# Patient Record
Sex: Female | Born: 1975 | Race: White | Hispanic: No | Smoking: Former smoker
Health system: Southern US, Community
[De-identification: ages and names within clinical notes are randomized; demographics above are authoritative.]

## PROBLEM LIST (undated history)

## (undated) DIAGNOSIS — F32A Depression, unspecified: Secondary | ICD-10-CM

## (undated) DIAGNOSIS — R9431 Abnormal electrocardiogram [ECG] [EKG]: Secondary | ICD-10-CM

## (undated) DIAGNOSIS — E559 Vitamin D deficiency, unspecified: Secondary | ICD-10-CM

## (undated) DIAGNOSIS — M51369 Other intervertebral disc degeneration, lumbar region without mention of lumbar back pain or lower extremity pain: Secondary | ICD-10-CM

## (undated) DIAGNOSIS — F419 Anxiety disorder, unspecified: Secondary | ICD-10-CM

## (undated) DIAGNOSIS — R202 Paresthesia of skin: Secondary | ICD-10-CM

## (undated) DIAGNOSIS — R011 Cardiac murmur, unspecified: Secondary | ICD-10-CM

## (undated) DIAGNOSIS — I447 Left bundle-branch block, unspecified: Secondary | ICD-10-CM

## (undated) DIAGNOSIS — G4730 Sleep apnea, unspecified: Secondary | ICD-10-CM

## (undated) DIAGNOSIS — H15009 Unspecified scleritis, unspecified eye: Secondary | ICD-10-CM

## (undated) DIAGNOSIS — M069 Rheumatoid arthritis, unspecified: Secondary | ICD-10-CM

## (undated) DIAGNOSIS — T8859XA Other complications of anesthesia, initial encounter: Secondary | ICD-10-CM

## (undated) DIAGNOSIS — M5136 Other intervertebral disc degeneration, lumbar region: Secondary | ICD-10-CM

## (undated) DIAGNOSIS — M5440 Lumbago with sciatica, unspecified side: Secondary | ICD-10-CM

## (undated) HISTORY — DX: Other intervertebral disc degeneration, lumbar region: M51.36

## (undated) HISTORY — DX: Rheumatoid arthritis, unspecified: M06.9

## (undated) HISTORY — DX: Paresthesia of skin: R20.2

## (undated) HISTORY — PX: LAPAROSCOPIC HYSTERECTOMY: SHX1926

## (undated) HISTORY — DX: Lumbago with sciatica, unspecified side: M54.40

## (undated) HISTORY — PX: LAMINECTOMY: SHX219

## (undated) HISTORY — DX: Left bundle-branch block, unspecified: I44.7

## (undated) HISTORY — DX: Unspecified scleritis, unspecified eye: H15.009

## (undated) HISTORY — DX: Vitamin D deficiency, unspecified: E55.9

## (undated) HISTORY — DX: Other intervertebral disc degeneration, lumbar region without mention of lumbar back pain or lower extremity pain: M51.369

## (undated) HISTORY — DX: Abnormal electrocardiogram (ECG) (EKG): R94.31

## (undated) HISTORY — PX: SPINAL CORD STIMULATOR INSERTION: SHX5378

---
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
## 2002-07-16 DIAGNOSIS — K649 Unspecified hemorrhoids: Secondary | ICD-10-CM | POA: Insufficient documentation

## 2003-11-08 HISTORY — PX: NISSEN FUNDOPLICATION: SHX2091

## 2017-05-18 DIAGNOSIS — M4310 Spondylolisthesis, site unspecified: Secondary | ICD-10-CM | POA: Insufficient documentation

## 2020-05-18 DIAGNOSIS — S0300XA Dislocation of jaw, unspecified side, initial encounter: Secondary | ICD-10-CM | POA: Insufficient documentation

## 2021-10-20 ENCOUNTER — Other Ambulatory Visit: Payer: Self-pay | Admitting: Student in an Organized Health Care Education/Training Program

## 2021-10-20 DIAGNOSIS — M5412 Radiculopathy, cervical region: Secondary | ICD-10-CM

## 2021-10-26 ENCOUNTER — Other Ambulatory Visit: Payer: Self-pay | Admitting: Internal Medicine

## 2021-10-26 DIAGNOSIS — R200 Anesthesia of skin: Secondary | ICD-10-CM

## 2021-10-26 DIAGNOSIS — R202 Paresthesia of skin: Secondary | ICD-10-CM

## 2021-11-10 ENCOUNTER — Ambulatory Visit
Admission: RE | Admit: 2021-11-10 | Discharge: 2021-11-10 | Disposition: A | Discharge status: Home / Self Care | Payer: BC Managed Care – PPO | Source: Ambulatory Visit | Attending: Student in an Organized Health Care Education/Training Program | Admitting: Student in an Organized Health Care Education/Training Program

## 2021-11-10 DIAGNOSIS — M5412 Radiculopathy, cervical region: Secondary | ICD-10-CM

## 2021-11-18 ENCOUNTER — Ambulatory Visit
Admission: RE | Admit: 2021-11-18 | Discharge: 2021-11-18 | Disposition: A | Discharge status: Home / Self Care | Payer: BC Managed Care – PPO | Source: Ambulatory Visit | Attending: Internal Medicine | Admitting: Internal Medicine

## 2021-11-18 DIAGNOSIS — R200 Anesthesia of skin: Secondary | ICD-10-CM

## 2021-11-18 DIAGNOSIS — R202 Paresthesia of skin: Secondary | ICD-10-CM

## 2021-11-18 MED ORDER — GADOBENATE DIMEGLUMINE 529 MG/ML IV SOLN
12.0000 mL | Freq: Once | INTRAVENOUS | Status: AC | PRN
  Administered 2021-11-18: 12 mL via INTRAVENOUS

## 2022-03-07 ENCOUNTER — Other Ambulatory Visit: Payer: Self-pay | Admitting: Orthopedic Surgery

## 2022-03-07 DIAGNOSIS — M961 Postlaminectomy syndrome, not elsewhere classified: Secondary | ICD-10-CM

## 2022-03-07 DIAGNOSIS — Q762 Congenital spondylolisthesis: Secondary | ICD-10-CM

## 2022-03-12 ENCOUNTER — Ambulatory Visit
Admission: RE | Admit: 2022-03-12 | Discharge: 2022-03-12 | Disposition: A | Discharge status: Home / Self Care | Payer: BC Managed Care – PPO | Source: Ambulatory Visit | Attending: Orthopedic Surgery | Admitting: Orthopedic Surgery

## 2022-03-12 DIAGNOSIS — M961 Postlaminectomy syndrome, not elsewhere classified: Secondary | ICD-10-CM

## 2022-03-12 DIAGNOSIS — Q762 Congenital spondylolisthesis: Secondary | ICD-10-CM

## 2022-04-18 ENCOUNTER — Other Ambulatory Visit: Payer: Self-pay | Admitting: Orthopedic Surgery

## 2022-04-18 DIAGNOSIS — M961 Postlaminectomy syndrome, not elsewhere classified: Secondary | ICD-10-CM

## 2022-04-18 DIAGNOSIS — M5432 Sciatica, left side: Secondary | ICD-10-CM

## 2022-04-18 DIAGNOSIS — M5431 Sciatica, right side: Secondary | ICD-10-CM

## 2022-04-27 ENCOUNTER — Ambulatory Visit
Admission: RE | Admit: 2022-04-27 | Discharge: 2022-04-27 | Disposition: A | Discharge status: Home / Self Care | Payer: BC Managed Care – PPO | Source: Ambulatory Visit | Attending: Orthopedic Surgery | Admitting: Orthopedic Surgery

## 2022-04-27 DIAGNOSIS — M961 Postlaminectomy syndrome, not elsewhere classified: Secondary | ICD-10-CM

## 2022-04-27 DIAGNOSIS — M5432 Sciatica, left side: Secondary | ICD-10-CM

## 2022-04-27 DIAGNOSIS — M5431 Sciatica, right side: Secondary | ICD-10-CM

## 2022-05-18 NOTE — Progress Notes (Unsigned)
  Cardiology Office Note:    Date:  05/18/2022   ID:  Nancy [name], DOB Jul 03, 1976, MRN [id number]  PCP:  [facility]   [facility] Providers Cardiologist:  Alverda Skeans, MD Referring MD: Linus Galas, NP   Chief Complaint/Reason for Referral: Left bundle branch block and preoperative evaluation  PATIENT DID NOT APPEAR FOR APPOINTMENT   ASSESSMENT:    1. Preoperative cardiovascular examination   2. Left bundle branch block     PLAN:    In order of problems listed above: 1.  Preoperative cardiovascular examination:  2.  Left bundle branch block:        History of Present Illness:    FOCUSED PROBLEM LIST:   1.  Chronic back pain status post L4-S1 repair 2.  Left bundle branch block  The patient is a 46 y.o. female with the indicated medical history here for recommendations regarding an abnormal EKG and preoperative evaluation.  The patient was seen by her primary care provider recently.  She is to have a revision of a previous back repair.  EKG was done which is not available for review but per report demonstrated sinus rhythm with a left bundle branch block and PVCs.      EKG:    Prior CV studies: None available  Other studies Reviewed: Review of the additional studies/records demonstrates: CT lumbar spine with L4-L5 disc bulging; no aortic atherosclerosis seen Signed, Orbie Pyo, MD  05/18/2022 9:35 AM    [facility] [address] [place], [place], [place]  [address] Phone: [phone]; Fax: [phone]   Note:  This document was prepared using Dragon voice recognition software and may include unintentional dictation errors.

## 2022-05-19 ENCOUNTER — Ambulatory Visit (INDEPENDENT_AMBULATORY_CARE_PROVIDER_SITE_OTHER): Payer: BC Managed Care – PPO | Admitting: Internal Medicine

## 2022-05-19 DIAGNOSIS — Z01810 Encounter for preprocedural cardiovascular examination: Secondary | ICD-10-CM

## 2022-05-19 DIAGNOSIS — I447 Left bundle-branch block, unspecified: Secondary | ICD-10-CM

## 2022-05-23 ENCOUNTER — Ambulatory Visit (INDEPENDENT_AMBULATORY_CARE_PROVIDER_SITE_OTHER): Payer: BC Managed Care – PPO | Admitting: Cardiology

## 2022-05-23 ENCOUNTER — Encounter: Payer: Self-pay | Admitting: Cardiology

## 2022-05-23 VITALS — BP 122/84 | HR 74 | Ht 59.5 in | Wt 146.0 lb

## 2022-05-23 DIAGNOSIS — I447 Left bundle-branch block, unspecified: Secondary | ICD-10-CM

## 2022-05-23 DIAGNOSIS — Z01810 Encounter for preprocedural cardiovascular examination: Secondary | ICD-10-CM

## 2022-05-23 MED ORDER — METOPROLOL TARTRATE 100 MG PO TABS: 100.0000 mg | ORAL_TABLET | Freq: Once | ORAL | 0 refills | Status: DC

## 2022-05-23 NOTE — Addendum Note (Signed)
Addended by: Theresia Majors on: 05/23/2022 10:37 AM   Modules accepted: Orders

## 2022-05-23 NOTE — Patient Instructions (Addendum)
Medication Instructions:  Your physician recommends that you continue on your current medications as directed. Please refer to the Current Medication list given to you today.  *If you need a refill on your cardiac medications before your next appointment, please call your pharmacy*  Testing/Procedures: Your physician has requested that you have an echocardiogram. Echocardiography is a painless test that uses sound waves to create images of your heart. It provides your doctor with information about the size and shape of your heart and how well your heart's chambers and valves are working. This procedure takes approximately one hour. There are no restrictions for this procedure.  Your physician has requested that you have cardiac CT. Cardiac computed tomography (CT) is a painless test that uses an x-ray machine to take clear, detailed pictures of your heart. Please follow instruction sheet as given.  Follow-Up: At [facility], you and your health needs are our priority.  As part of our continuing mission to provide you with exceptional heart care, we have created designated Provider Care Teams.  These Care Teams include your primary Cardiologist (physician) and Advanced Practice Providers (APPs -  Physician Assistants and Nurse Practitioners) who all work together to provide you with the care you need, when you need it.  Your next appointment:   Follow up with Dr. Mayford Knife as needed based on results of testing.   Other Instructions   Your cardiac CT will be scheduled at:   [facility] [address] [place], [place] [address] [phone]  Please arrive at the [facility] and Children's Entrance (Entrance C2) of [facility] 30 minutes prior to test start time. You can use the FREE valet parking offered at entrance C (encouraged to control the heart rate for the test)  Proceed to the [facility] Radiology Department (first floor) to check-in and test prep.  All radiology  patients and guests should use entrance C2 at [facility], accessed from [facility], even though the hospital's physical address listed is [address]     Please follow these instructions carefully (unless otherwise directed):  On the Night Before the Test: Be sure to Drink plenty of water. Do not consume any caffeinated/decaffeinated beverages or chocolate 12 hours prior to your test. Do not take any antihistamines 12 hours prior to your test.  On the Day of the Test: Drink plenty of water until 1 hour prior to the test. Do not eat any food 4 hours prior to the test. You may take your regular medications prior to the test.  Take metoprolol (Lopressor) two hours prior to test. FEMALES- please wear underwire-free bra if available, avoid dresses & tight clothing  After the Test: Drink plenty of water. After receiving IV contrast, you may experience a mild flushed feeling. This is normal. On occasion, you may experience a mild rash up to 24 hours after the test. This is not dangerous. If this occurs, you can take Benadryl 25 mg and increase your fluid intake. If you experience trouble breathing, this can be serious. If it is severe call 911 IMMEDIATELY. If it is mild, please call our office. If you take any of these medications: Glipizide/Metformin, Avandament, Glucavance, please do not take 48 hours after completing test unless otherwise instructed.  We will call to schedule your test 2-4 weeks out understanding that some insurance companies will need an authorization prior to the service being performed.   For non-scheduling related questions, please contact the cardiac imaging nurse navigator  should you have any questions/concerns: Rockwell Alexandria, Cardiac Imaging Nurse Navigator Larey Brick, Cardiac Imaging Nurse Navigator [facility] Direct Office Dial: [phone]   For scheduling needs, including cancellations and  rescheduling, please call [place], [phone].   Important Information About Sugar

## 2022-05-23 NOTE — Progress Notes (Signed)
Cardiology CONSULT Note    Date:  05/23/2022   ID:  Nancy [name], DOB 04-07-1976, MRN [id number]  PCP:  [facility]  Cardiologist:  Armanda Magic, MD   Chief Complaint  Patient presents with   New Patient (Initial Visit)    Preoperative cardiovascular clearance and left bundle branch block    History of Present Illness:  Nancy [name] is a 46 y.o. female who is being seen today for the evaluation of preoperative cardiac clearance for back surgery and left bundle branch block at the request of Linus Galas, NP.  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  She has had a prior laminectomy in the past and is now referred for preoperative clearance for further lumbar back surgery.  She denies any chest pain or chest pressure but occasionally has problems with GERD (had Nissen in the past).  At times she will have some mild DOE when getting out of bed.  She says that about 4-5 months ago she was swimming and noticed her heart racing and skipped a beat but has not had any further episodes.  She denies any  PND, orthopnea or syncope.  She has had some LE edema in the past. She has a hx of tobacco use in the past and quit 5 years ago but started when she was a teenager.   Her brother died of suicide and was found to have CAD at autopsy.  She does not keep in touch with her other biological family.   Past Medical History:  Diagnosis Date   Abnormal EKG    DDD (degenerative disc disease), lumbar    LBBB (left bundle branch block)    Lumbago with sciatica    Paresthesia of lower lip    Rheumatoid arthritis (HCC)    Scleritis    Vitamin D deficiency     Past Surgical History:  Procedure Laterality Date   CESAREAN SECTION  1995   LAMINECTOMY     2019   LAPAROSCOPIC HYSTERECTOMY     2010   NISSEN FUNDOPLICATION  2005   SPINAL CORD STIMULATOR INSERTION     2021    Current Medications: Current Meds   Medication Sig   cycloSPORINE (RESTASIS) 0.05 % ophthalmic emulsion Place 1 drop into both eyes 2 (two) times daily.   cycloSPORINE (RESTASIS) 0.05 % ophthalmic emulsion Place 1 drop into both eyes daily at 12 noon.   fluticasone (FLONASE) 50 MCG/ACT nasal spray Place into both nostrils daily.   inFLIXimab-axxq (AVSOLA) 100 MG injection Inject 100 mg into the vein every 6 (six) weeks.   leflunomide (ARAVA) 20 MG tablet Take 20 mg by mouth daily.   Multiple Vitamin (MULTIVITAMIN ADULT PO) Take by mouth.   Probiotic Product (PROBIOTIC PO) Take by mouth.   Vitamin D, Ergocalciferol, (DRISDOL) 1.25 MG (50000 UNIT) CAPS capsule Take 50,000 Units by mouth every 7 (seven) days.    Allergies:   Erythromycin, Nabumetone, and Penicillins   Social History   Socioeconomic History   Marital status: Divorced    Spouse name: Not on file   Number of children: Not on file   Years of education: Not on file   Highest education level: Not on file  Occupational History   Not on file  Tobacco Use   Smoking status: Never   Smokeless tobacco: Never  Substance and Sexual Activity   Alcohol use: Never   Drug use: Never   Sexual activity: Not  on file  Other Topics Concern   Not on file  Social History Narrative   Not on file   Social Determinants of Health   Financial Resource Strain: Not on file  Food Insecurity: Not on file  Transportation Needs: Not on file  Physical Activity: Not on file  Stress: Not on file  Social Connections: Not on file     Family History:  The patient's family history includes Coronary artery disease in her brother; Rheum arthritis in her maternal grandmother and mother; Suicidality (age of onset: 72) in her brother.   ROS:   Please see the history of present illness.    ROS All other systems reviewed and are negative.      No data to display             PHYSICAL EXAM:   VS:  BP 122/84   Pulse 74   Ht 4' 11.5" (1.511 m)   Wt 146 lb (66.2 kg)   SpO2  97%   BMI 28.99 kg/m    GEN: Well nourished, well developed, in no acute distress  HEENT: normal  Neck: no JVD, carotid bruits, or masses Cardiac: RRR; no murmurs, rubs, or gallops,no edema.  Intact distal pulses bilaterally.  Respiratory:  clear to auscultation bilaterally, normal work of breathing GI: soft, nontender, nondistended, + BS MS: no deformity or atrophy  Skin: warm and dry, no rash Neuro:  Alert and Oriented x 3, Strength and sensation are intact Psych: euthymic mood, full affect  Wt Readings from Last 3 Encounters:  05/23/22 146 lb (66.2 kg)      Studies/Labs Reviewed:   EKG:  EKG is not ordered today.  The ekg performed by PCP 05/16/2022 shows normal sinus rhythm with left bundle branch block.  I do not have any old EKG to compare  to  Recent Labs: No results found for requested labs within last 365 days.   Lipid Panel No results found for: "CHOL", "TRIG", "HDL", "CHOLHDL", "VLDL", "LDLCALC", "LDLDIRECT"   Additional studies/ records that were reviewed today include:  EKG in office visit notes from PCP    ASSESSMENT:    1. Left bundle branch block   2. Preoperative cardiovascular examination   3. LBBB (left bundle branch block)      PLAN:  In order of problems listed above:  Left bundle branch block -There is no history of left bundle branch block in the past but there is no old EKG for me to review or for her PCP to review so do not know the chronicity of this.  The patient says she is never been told she has abnormal EKG in the past -Check 2D echocardiogram to assess LV function -She has CRFs including hx of tobacco use and fm hx of CAD at an early age -check coronary CTA since she has had some exertional SOB  2.  Cardiac clearance -She is having revision of prior back surgery -she has had some mild DOE and has a hx of tobacco use and premature CAD in her brother -I think we should get a coronary CTA to define coronary anatomy due to her sx, CRFs  and abnormal EKG prior to surgery  Recommendations: According to ACC/AHA guidelines, no further cardiovascular testing needed.  The patient may proceed to surgery at acceptable risk.     Time Spent: 20 minutes total time of encounter, including 15 minutes spent in face-to-face patient care on the date of this encounter. This time includes coordination of  care and counseling regarding above mentioned problem list. Remainder of non-face-to-face time involved reviewing chart documents/testing relevant to the patient encounter and documentation in the medical record. I have independently reviewed documentation from referring provider  Medication Adjustments/Labs and Tests Ordered: Current medicines are reviewed at length with the patient today.  Concerns regarding medicines are outlined above.  Medication changes, Labs and Tests ordered today are listed in the Patient Instructions below.  There are no Patient Instructions on file for this visit.   Signed, Armanda Magic, MD  05/23/2022 10:29 AM    [facility] [address] [place], [place], [place]  [address] Phone: [phone]; Fax: [phone]

## 2022-05-24 DIAGNOSIS — M069 Rheumatoid arthritis, unspecified: Secondary | ICD-10-CM | POA: Insufficient documentation

## 2022-05-31 ENCOUNTER — Telehealth: Payer: Self-pay | Admitting: *Deleted

## 2022-05-31 NOTE — Telephone Encounter (Signed)
   Pre-operative Risk Assessment    Patient Name: Nancy [name]  DOB: 07/20/1976 MRN: [id number]      Request for Surgical Clearance    Procedure:   REVISION L4-S1 PSF  Date of Surgery:  Clearance TBD                                 Surgeon:  DR. Malachy Chamber Surgeon's Group or Practice Name:  [facility]  Phone number:  [phone] Fax number:  [phone] OR [phone]   Type of Clearance Requested:   - Medical ; NO MEDICATIONS ARE LISTED AS NEEDING TO BE HELD   Type of Anesthesia:  Not Indicated   Additional requests/questions:    Elpidio Anis   05/31/2022, 6:20 PM

## 2022-06-01 ENCOUNTER — Ambulatory Visit: Payer: BC Managed Care – PPO | Admitting: Internal Medicine

## 2022-06-01 NOTE — Telephone Encounter (Signed)
Pending echocardiogram on 8/3 and coronary CT on 8/7 before final clearance

## 2022-06-08 ENCOUNTER — Ambulatory Visit: Payer: BC Managed Care – PPO | Admitting: Cardiovascular Disease

## 2022-06-09 ENCOUNTER — Ambulatory Visit (HOSPITAL_COMMUNITY): Payer: BC Managed Care – PPO | Attending: Cardiology

## 2022-06-09 DIAGNOSIS — I447 Left bundle-branch block, unspecified: Secondary | ICD-10-CM

## 2022-06-09 DIAGNOSIS — Z01810 Encounter for preprocedural cardiovascular examination: Secondary | ICD-10-CM | POA: Diagnosis present

## 2022-06-09 LAB — ECHOCARDIOGRAM COMPLETE
AR max vel: 3.36 cm2
AV Area VTI: 3.18 cm2
AV Area mean vel: 3.22 cm2
AV Mean grad: 2.3 mmHg
AV Peak grad: 4.4 mmHg
Ao pk vel: 1.05 m/s
Area-P 1/2: 3.45 cm2
S' Lateral: 3.33 cm

## 2022-06-10 ENCOUNTER — Telehealth (HOSPITAL_COMMUNITY): Payer: Self-pay | Admitting: *Deleted

## 2022-06-10 NOTE — Telephone Encounter (Signed)
Reaching out to patient to offer assistance regarding upcoming cardiac imaging study; pt verbalizes understanding of appt date/time, parking situation and where to check in, pre-test NPO status and medications ordered, and verified current allergies; name and call back number provided for further questions should they arise  Janari Yamada RN Navigator Cardiac Imaging [facility] [phone] office [phone] cell  Patient to take 100mg metoprolol tartrate two hours prior to her cardiac CT scan.  She is aware to arrive at 2pm. 

## 2022-06-12 ENCOUNTER — Telehealth (HOSPITAL_COMMUNITY): Payer: Self-pay | Admitting: Emergency Medicine

## 2022-06-12 NOTE — Telephone Encounter (Signed)
Calling to inform patient that her CCTA appt was cancelled tomorrow due to reading software glitch. Pt verbally upset over delay in this test.  New appt for 8/17 at 1:30pm  Rockwell Alexandria RN Navigator Cardiac Imaging [facility] [phone] Office  [phone] Cell

## 2022-06-13 ENCOUNTER — Ambulatory Visit (HOSPITAL_COMMUNITY): Admission: RE | Admit: 2022-06-13 | Payer: BC Managed Care – PPO | Source: Ambulatory Visit

## 2022-06-13 ENCOUNTER — Emergency Department (HOSPITAL_COMMUNITY)
Admission: EM | Admit: 2022-06-13 | Discharge: 2022-06-13 | Disposition: A | Discharge status: Home / Self Care | Payer: BC Managed Care – PPO | Attending: Emergency Medicine | Admitting: Emergency Medicine

## 2022-06-13 ENCOUNTER — Encounter (HOSPITAL_COMMUNITY): Payer: Self-pay | Admitting: Emergency Medicine

## 2022-06-13 ENCOUNTER — Other Ambulatory Visit: Payer: Self-pay

## 2022-06-13 ENCOUNTER — Emergency Department (HOSPITAL_COMMUNITY): Payer: BC Managed Care – PPO

## 2022-06-13 DIAGNOSIS — R110 Nausea: Secondary | ICD-10-CM | POA: Diagnosis not present

## 2022-06-13 DIAGNOSIS — R0789 Other chest pain: Secondary | ICD-10-CM | POA: Insufficient documentation

## 2022-06-13 DIAGNOSIS — R079 Chest pain, unspecified: Secondary | ICD-10-CM | POA: Diagnosis present

## 2022-06-13 LAB — I-STAT BETA HCG BLOOD, ED (MC, WL, AP ONLY): I-stat hCG, quantitative: <5 m[IU]/mL (ref <5)

## 2022-06-13 LAB — BASIC METABOLIC PANEL
Anion gap: 6 (ref 5–15)
BUN: 6 mg/dL (ref 6–20)
CO2: 24 mmol/L (ref 22–32)
Calcium: 9.2 mg/dL (ref 8.9–10.3)
Chloride: 108 mmol/L (ref 98–111)
Creatinine, Ser: 0.79 mg/dL (ref 0.44–1.00)
GFR, Estimated: >60 mL/min (ref >60)
Glucose, Bld: 108 mg/dL — ABNORMAL HIGH (ref 70–99)
Potassium: 4.1 mmol/L (ref 3.5–5.1)
Sodium: 138 mmol/L (ref 135–145)

## 2022-06-13 LAB — CBC
HCT: 44.7 % (ref 36.0–46.0)
Hemoglobin: 15 g/dL (ref 12.0–15.0)
MCH: 31.6 pg (ref 26.0–34.0)
MCHC: 33.6 g/dL (ref 30.0–36.0)
MCV: 94.3 fL (ref 80.0–100.0)
Platelets: 309 10*3/uL (ref 150–400)
RBC: 4.74 MIL/uL (ref 3.87–5.11)
RDW: 12.4 % (ref 11.5–15.5)
WBC: 4.3 10*3/uL (ref 4.0–10.5)
nRBC: 0 % (ref 0.0–0.2)

## 2022-06-13 LAB — TROPONIN I (HIGH SENSITIVITY)
Troponin I (High Sensitivity): 3 ng/L (ref <18)
Troponin I (High Sensitivity): <2 ng/L (ref <18)

## 2022-06-13 NOTE — ED Provider Notes (Signed)
[facility] EMERGENCY DEPARTMENT Provider Note   CSN: [id number] Arrival date & time: 06/13/22  9983     History  Chief Complaint  Patient presents with   Chest Pain    Nancy [name] is a 46 y.o. female. With past medical history of LBBB, RA, DDD who presents to the emergency department with chest pain.  States she woke up around 7:00 this morning and had a sharp, constant, nonradiating chest pain in the left side of her chest.  States that it was a sharp and shooting.  She had associated nausea without vomiting.  Denies shortness of breath, palpitations, diaphoresis.  Denies radiation down her arm or jaw.  Denies lower extremity swelling.  States that the pain lasted until about 1245 prior to me evaluating her and she is now chest pain-free.  She has a known left bundle branch block and is currently being evaluated by cardiology as a preoperative work-up for back surgery.   Chest Pain Associated symptoms: nausea   Associated symptoms: no diaphoresis, no dizziness, no palpitations, no shortness of breath and no vomiting        Home Medications Prior to Admission medications   Medication Sig Start Date End Date Taking? Authorizing Provider  cycloSPORINE (RESTASIS) 0.05 % ophthalmic emulsion Place 1 drop into both eyes 2 (two) times daily.    [provider]  cycloSPORINE (RESTASIS) 0.05 % ophthalmic emulsion Place 1 drop into both eyes daily at 12 noon.    [provider]  fluticasone (FLONASE) 50 MCG/ACT nasal spray Place into both nostrils daily.    [provider]  inFLIXimab-axxq (AVSOLA) 100 MG injection Inject 100 mg into the vein every 6 (six) weeks.    [provider]  leflunomide (ARAVA) 20 MG tablet Take 20 mg by mouth daily.    [provider]  metoprolol tartrate (LOPRESSOR) 100 MG tablet Take 1 tablet (100 mg total) by mouth once for 1 dose. Take 1 tablet (100 mg total) two hours prior to CT scan. 05/23/22  05/23/22  Quintella Reichert, MD  Multiple Vitamin (MULTIVITAMIN ADULT PO) Take by mouth.    [provider]  Probiotic Product (PROBIOTIC PO) Take by mouth.    [provider]  Vitamin D, Ergocalciferol, (DRISDOL) 1.25 MG (50000 UNIT) CAPS capsule Take 50,000 Units by mouth every 7 (seven) days.    [provider]      Allergies    Erythromycin, Nabumetone, and Penicillins    Review of Systems   Review of Systems  Constitutional:  Negative for diaphoresis.  Respiratory:  Negative for shortness of breath.   Cardiovascular:  Positive for chest pain. Negative for palpitations and leg swelling.  Gastrointestinal:  Positive for nausea. Negative for vomiting.  Neurological:  Negative for dizziness, syncope and light-headedness.  All other systems reviewed and are negative.   Physical Exam Updated Vital Signs BP 129/80 (BP Location: Left Arm)   Pulse 60   Temp 98.1 F (36.7 C) (Oral)   Resp 17   Ht 5' (1.524 m)   Wt 66 kg   SpO2 99%   BMI 28.42 kg/m  Physical Exam Vitals and nursing note reviewed.  Constitutional:      General: She is not in acute distress.    Appearance: Normal appearance. She is well-developed and normal weight. She is not ill-appearing or diaphoretic.  HENT:     Head: Normocephalic and atraumatic.     Mouth/Throat:     Mouth: Mucous membranes  are moist.     Pharynx: Oropharynx is clear.  Eyes:     General: No scleral icterus.    Extraocular Movements: Extraocular movements intact.     Pupils: Pupils are equal, round, and reactive to light.  Neck:     Vascular: No JVD.  Cardiovascular:     Rate and Rhythm: Normal rate and regular rhythm.     Pulses: Normal pulses.          Radial pulses are 2+ on the right side and 2+ on the left side.     Heart sounds: Normal heart sounds. No murmur heard. Pulmonary:     Effort: Pulmonary effort is normal. No tachypnea or respiratory distress.     Breath sounds: Normal breath sounds.  Chest:      Chest wall: No tenderness.  Abdominal:     General: Bowel sounds are normal.     Palpations: Abdomen is soft.  Musculoskeletal:        General: Normal range of motion.     Cervical back: Normal range of motion and neck supple.     Right lower leg: No edema.     Left lower leg: No edema.  Skin:    General: Skin is warm and dry.     Capillary Refill: Capillary refill takes less than 2 seconds.  Neurological:     General: No focal deficit present.     Mental Status: She is alert and oriented to person, place, and time. Mental status is at baseline.  Psychiatric:        Mood and Affect: Mood normal.        Behavior: Behavior normal.        Thought Content: Thought content normal.        Judgment: Judgment normal.     ED Results / Procedures / Treatments   Labs (all labs ordered are listed, but only abnormal results are displayed) Labs Reviewed  BASIC METABOLIC PANEL - Abnormal; Notable for the following components:      Result Value   Glucose, Bld 108 (*)    All other components within normal limits  CBC  I-STAT BETA HCG BLOOD, ED ([facility], [facility], [facility] ONLY)  TROPONIN I (HIGH SENSITIVITY)  TROPONIN I (HIGH SENSITIVITY)   EKG EKG Interpretation  Date/Time:  Monday June 13 2022 09:36:25 EDT Ventricular Rate:  78 PR Interval:  148 QRS Duration: 138 QT Interval:  434 QTC Calculation: 494 R Axis:   105 Text Interpretation: Normal sinus rhythm Rightward axis Non-specific intra-ventricular conduction block Abnormal ECG No previous ECGs available Confirmed by Vanetta Mulders [phone]) on 06/13/2022 2:54:54 PM  Radiology DG Chest 2 View  Result Date: 06/13/2022 CLINICAL DATA:  46 year old female with left side chest pain since waking. History of abnormal EKG. EXAM: CHEST - 2 VIEW COMPARISON:  No prior chest imaging. FINDINGS: Midthoracic spinal stimulator device in place. Normal cardiac size and mediastinal contours. Visualized tracheal air column is within normal limits. Lung volumes  are within normal limits. Lung markings are within normal limits. Both lungs appear clear. No pneumothorax or pleural effusion. Negative visible bowel gas. No acute osseous abnormality identified. IMPRESSION: No acute cardiopulmonary abnormality. Electronically Signed   By: Odessa Fleming M.D.   On: 06/13/2022 10:01    Procedures Procedures   Medications Ordered in ED Medications - No data to display  ED Course/ Medical Decision Making/ A&P  Medical Decision Making Amount and/or Complexity of Data Reviewed Labs: ordered. Radiology: ordered.  This patient presents to the ED with chief complaint(s) of chest pain with pertinent past medical history of LBBB which further complicates the presenting complaint. The complaint involves an extensive differential diagnosis and also carries with it a high risk of complications and morbidity.    The differential diagnosis includes Acute chest syndrome, stable angina, atypical angina, pulmonary embolism, pneumothorax, dissection, pleural effusion, CHF, COPD, asthma, myocarditis, pericarditis, chest wall pain    The initial plan is to obtain EKG, basic labs including troponin, CXR   Additional history obtained: Additional history obtained from  none available Records reviewed Care Everywhere/External Records and Primary Care Documents  Independent labs interpretation:  The following labs were independently interpreted: CBC without leukocytosis or anemia, BMP within normal limits, troponin 3, less than 2, delta -1, not pregnant  Independent visualization of imaging: - I independently visualized the following imaging with scope of interpretation limited to determining acute life threatening conditions related to emergency care: Chest x-ray, which revealed no acute findings  Treatment and Reassessment: On my exam the patient is euvolemic, does not appear fluid volume overloaded and I doubt heart failure at this time.  She has no PND  or orthopnea symptoms. EKG shows a left bundle branch block which is not new for her.  We also obtained 2 troponins which were negative.  Doubt ACS at this time.  Her symptoms are also atypical of ACS. Chest x-ray does not show any pneumonia, pneumothorax or pleural effusion.  Also shows no fluid volume overload.  Her symptoms are also inconsistent with an aortic dissection, myocarditis, pericarditis or tamponade.  HEART Score: 2   Low heart score.  She is currently being followed by cardiology as a preoperative clearance.  She is scheduled to have a coronary CT prior to her surgery on 06/23/2022.  She just had an echocardiogram on 06/09/2022 which showed an EF of 50 to 55%.  Feel that she is appropriate for discharge with primary care and cardiology follow-up on her CT.  She is given return precautions for worsening chest pain or shortness of breath.  Patient verbalized understanding.  Consultation: - Consulted or discussed management/test interpretation w/ external professional: N/A  Consideration for admission or further workup: Consider but doubt need for any admission or further work-up at this time.  She is overall well-appearing and is currently chest pain-free with an negative ACS work-up.  Feel that she is safe for discharge Social Determinants of health: None identified Final Clinical Impression(s) / ED Diagnoses Final diagnoses:  Atypical chest pain    Rx / DC Orders ED Discharge Orders     None         Cristopher Peru, PA-C 06/13/22 1456    Vanetta Mulders, MD 06/17/22 [phone]

## 2022-06-13 NOTE — Discharge Instructions (Addendum)
You were seen in the emergency department today for chest pain. While you were here we completed and EKG which showed that you have a left bundle branch block which is consistent with the EKG that you had at the cardiologist. Additionally, your labs, including two troponins, and chest x-ray are all normal. Please follow-up with Dr. Mayford Knife for your coronary CT and pre-operative work up. Please return for worsening chest pain or shortness of breath.

## 2022-06-13 NOTE — ED Triage Notes (Signed)
Patient here with complaint of stabbing left sided chest pain and nausea that was present on waking this morning. Patient also reports dizziness and diarrhea, denies shortness of breath. Patient is alert, oriented, ambulatory, speaking in complete sentences and is in no apparent distress at this time.

## 2022-06-16 NOTE — Telephone Encounter (Addendum)
   Patient Name: Colandra Ohanian  DOB: 28-Sep-1976 MRN: [id number]  Primary Cardiologist: Dr. Mayford Knife  Chart reviewed as part of pre-operative protocol coverage. Patient was already seen 05/23/22 by Dr. Armanda Magic for pre-op cardiovascular examination at which time she recommended echocardiogram and subsequent cardiac MRI and coronary CTA. Her final studies are still pending at this time. Per office protocol, the provider who saw the patient will need to forward their finalized clearance decision to requesting party below upon completion of testing. Will cc to MD so she is aware where to fax final recommendation to.  I will route this message to requesting party so they're aware that final recommendations are forthcoming from evaluating provider. I remove this message from the pre-op box as separate preop APP input is not needed at this time.  Laurann Montana, PA-C 06/16/2022, 7:59 AM

## 2022-06-21 ENCOUNTER — Encounter (HOSPITAL_COMMUNITY): Payer: Self-pay

## 2022-06-22 ENCOUNTER — Telehealth (HOSPITAL_COMMUNITY): Payer: Self-pay | Admitting: *Deleted

## 2022-06-22 NOTE — Telephone Encounter (Signed)
Reaching back out to patient to make sure she didn't have any questions regarding her cardiac CT instructions since the last time they were reviewed. She denies having any questions and is aware to arrive at 1:00pm.  Larey Brick RN Navigator Cardiac Imaging [facility] [phone] Office [phone] Cell

## 2022-06-23 ENCOUNTER — Ambulatory Visit (HOSPITAL_COMMUNITY)
Admission: RE | Admit: 2022-06-23 | Discharge: 2022-06-23 | Disposition: A | Discharge status: Home / Self Care | Payer: BC Managed Care – PPO | Source: Ambulatory Visit | Attending: Cardiology | Admitting: Cardiology

## 2022-06-23 DIAGNOSIS — Z01810 Encounter for preprocedural cardiovascular examination: Secondary | ICD-10-CM | POA: Insufficient documentation

## 2022-06-23 DIAGNOSIS — I447 Left bundle-branch block, unspecified: Secondary | ICD-10-CM | POA: Diagnosis present

## 2022-06-23 MED ORDER — NITROGLYCERIN 0.4 MG SL SUBL
SUBLINGUAL_TABLET | SUBLINGUAL | Status: AC
  Filled 2022-06-23: qty 2

## 2022-06-23 MED ORDER — IOHEXOL 350 MG/ML SOLN
100.0000 mL | Freq: Once | INTRAVENOUS | Status: AC | PRN
  Administered 2022-06-23: 100 mL via INTRAVENOUS

## 2022-06-23 MED ORDER — NITROGLYCERIN 0.4 MG SL SUBL
0.8000 mg | SUBLINGUAL_TABLET | Freq: Once | SUBLINGUAL | Status: AC
  Administered 2022-06-23: 0.8 mg via SUBLINGUAL

## 2022-07-04 ENCOUNTER — Encounter (HOSPITAL_BASED_OUTPATIENT_CLINIC_OR_DEPARTMENT_OTHER): Payer: Self-pay | Admitting: Cardiology

## 2022-07-04 NOTE — Telephone Encounter (Signed)
   Pt seen by Dr. Mayford Knife 05/23/22 for surgical clearance.  Echocardiogram and Coronary CTA completed. Cardiac MRI still pending.  Will route to Dr. Mayford Knife for input regarding surgical clearance.  Tereso Newcomer, PA-C    07/04/2022 4:42 PM

## 2022-07-04 NOTE — Telephone Encounter (Signed)
Patient calling to ask that we refax the info over because the office hasnt received anything. Please advise

## 2022-07-05 NOTE — Telephone Encounter (Signed)
Notes faxed to surgeon. This phone note will be removed from the preop pool. Tereso Newcomer, PA-C  07/05/2022 1:28 PM

## 2022-07-05 NOTE — Telephone Encounter (Signed)
   Name: Nancy [name] DOB: Dec 29, 1975  MRN: [id number]  Primary Cardiologist: None  Chart reviewed as part of pre-operative protocol coverage.   Patient evaluated by Dr. Mayford Knife 05/23/22: (Assessment and Plan from her note)   Left bundle branch block -There is no history of left bundle branch block in the past but there is no old EKG for me to review or for her PCP to review so do not know the chronicity of this.  The patient says she is never been told she has abnormal EKG in the past -Check 2D echocardiogram to assess LV function -She has CRFs including hx of tobacco use and fm hx of CAD at an early age -check coronary CTA since she has had some exertional SOB   2.  Cardiac clearance -She is having revision of prior back surgery -she has had some mild DOE and has a hx of tobacco use and premature CAD in her brother -I think we should get a coronary CTA to define coronary anatomy due to her sx, CRFs and abnormal EKG prior to surgery  ECHO COMPLETE WO IMAGING ENHANCING AGENT 06/09/2022 IMPRESSIONS 1. Left ventricular ejection fraction, by estimation, is 50 to 55%. The left ventricle has low normal function. The left ventricle has no regional wall motion abnormalities. Left ventricular diastolic parameters are consistent with Grade I diastolic dysfunction (impaired relaxation). 2. Right ventricular systolic function is normal. The right ventricular size is normal. Tricuspid regurgitation signal is inadequate for assessing PA pressure. 3. The mitral valve is normal in structure. Trivial mitral valve regurgitation. No evidence of mitral stenosis. 4. The aortic valve is tricuspid. Aortic valve regurgitation is not visualized. No aortic stenosis is present. 5. The inferior vena cava is dilated in size with >50% respiratory variability, suggesting right atrial pressure of 8 mmHg.   CT CORONARY MORPH W/CTA COR W/SCORE W/CA W/CM &/OR WO/CM 06/23/2022 IMPRESSION: 1. Coronary calcium score  of 0. This was 0 percentile for age-, sex, and race-matched controls. 2. Normal coronary origin with right dominance. 3. Normal coronary arteries.  CAD RADS 0.  Reviewed findings for echocardiogram and Coronary CTA with Dr. Mayford Knife. Cardiac MRI still pending.   Recommendations: Based on ACC/AHA guidelines, the patient is at acceptable risk for the planned procedure and may proceed without further cardiovascular testing.     Please call with questions. Tereso Newcomer, PA-C 07/05/2022, 1:22 PM

## 2022-07-12 ENCOUNTER — Other Ambulatory Visit: Payer: Self-pay | Admitting: Obstetrics and Gynecology

## 2022-07-12 DIAGNOSIS — R928 Other abnormal and inconclusive findings on diagnostic imaging of breast: Secondary | ICD-10-CM

## 2022-07-18 NOTE — Telephone Encounter (Signed)
Clearance notes have been re-faxed x 3 to surgeon office. Faxed both electronically and manually faxed. Confirmation has been received each time that we have faxed. [facility] strives to take the very best care of our patients. I will fax this note as FYI to surgeon office,.

## 2022-07-18 NOTE — Telephone Encounter (Signed)
I will forward this to pre op provider to see if the pt has been cleared. See notes from Tereso Newcomer, [facility] in regard to pt needing CTA, which was competed. Pt states she is going to come by and pick up clearance herself tomorrow. Please advise if the pt has been cleared.

## 2022-07-18 NOTE — Telephone Encounter (Signed)
Patient is calling stating the requesting office has still not received this. She is requesting to pick it up herself tomorrow to take to the requesting office herself. Please advise.

## 2022-07-22 DIAGNOSIS — M961 Postlaminectomy syndrome, not elsewhere classified: Secondary | ICD-10-CM | POA: Insufficient documentation

## 2022-07-27 ENCOUNTER — Ambulatory Visit
Admission: RE | Admit: 2022-07-27 | Discharge: 2022-07-27 | Disposition: A | Discharge status: Home / Self Care | Payer: BC Managed Care – PPO | Source: Ambulatory Visit | Attending: Obstetrics and Gynecology | Admitting: Obstetrics and Gynecology

## 2022-07-27 ENCOUNTER — Other Ambulatory Visit: Payer: Self-pay | Admitting: Obstetrics and Gynecology

## 2022-07-27 DIAGNOSIS — R928 Other abnormal and inconclusive findings on diagnostic imaging of breast: Secondary | ICD-10-CM

## 2022-07-27 DIAGNOSIS — N6489 Other specified disorders of breast: Secondary | ICD-10-CM

## 2022-08-05 ENCOUNTER — Ambulatory Visit
Admission: RE | Admit: 2022-08-05 | Discharge: 2022-08-05 | Disposition: A | Discharge status: Home / Self Care | Payer: BC Managed Care – PPO | Source: Ambulatory Visit | Attending: Obstetrics and Gynecology | Admitting: Obstetrics and Gynecology

## 2022-08-05 DIAGNOSIS — N6489 Other specified disorders of breast: Secondary | ICD-10-CM

## 2022-11-15 ENCOUNTER — Ambulatory Visit: Payer: Commercial Managed Care - HMO | Admitting: Nurse Practitioner

## 2022-11-15 ENCOUNTER — Encounter: Payer: Self-pay | Admitting: Nurse Practitioner

## 2022-11-15 VITALS — BP 130/80 | HR 76 | Ht 59.0 in | Wt 154.0 lb

## 2022-11-15 DIAGNOSIS — K649 Unspecified hemorrhoids: Secondary | ICD-10-CM | POA: Diagnosis not present

## 2022-11-15 MED ORDER — NIFEDIPINE 0.3 % OINTMENT: 1.0000 | TOPICAL_OINTMENT | Freq: Four times a day (QID) | CUTANEOUS | 2 refills | Status: DC

## 2022-11-15 NOTE — Patient Instructions (Addendum)
It was a pleasure seeing you today! Thank you for trusting me with your care.   If you take regular prescription medications, please contact your pharmacy for routine refill requests. They will send this directly to [place].  If you were ordered new medication as a part of your examination and treatment today, please contact your pharmacy to determine the status. Many prescriptions require a prior authorization and the pharmacy will contact us to get this started. This may take a few days for approval or denial. If the medication is denied, we will work with you to try alternative medications.   If you have any questions or concerns, please do not hesitate to contact the office via telephone or [place].  MyChart messages are received by the [place] staff during regular business hours Monday through Friday and we do our best to respond in a timely manner.  If your request requires an appointment, the staff will gladly help set that up so that we have the time dedicated to ensure that your questions are appropriately answered.

## 2022-11-15 NOTE — Progress Notes (Signed)
Orma Render, DNP, AGNP-c Primary Care & Sports Medicine [address] [place], [place] [address] [place] [phone]   New patient visit   Patient: Nancy [name]   DOB: May 30, 1976   47 y.o. Female  MRN: [id number] Visit Date: 11/15/2022  Patient Care Team: Orma Render, NP as PCP - General (Nurse Practitioner)  Today's Vitals   11/15/22 1127  BP: 130/80  Pulse: 76  Weight: 154 lb (69.9 kg)  Height: 4\' 11"  (1.499 m)   Body mass index is 31.1 kg/m.   Today's healthcare provider: Orma Render, NP   Chief Complaint  Patient presents with   Establish Care    Get established. Does not need any refills at the present time.    Subjective    Nancy [name] is a 47 y.o. female who presents today as a new patient to establish care.    Patient endorses the following concerns presently: Review of PMH with patient today.  She is seeing Dr. Dorathy Daft seeing her for RA. She is currently on avsola about once a month and tolerating this therapy well.  Her last colonoscopy was 2021 with [place]. She had an endoscopy at that time, as well. She has a history of fundoplication. Currently having concerns with ongoing hemorrhoids. She would like to know what treatment could be done for these outside of surgical intervention.  2018 L5-S1 laminectomy. Due to ongoing symptoms returned to surgery for removal of hardware in October with fusion. Currently has implanted stimulator in spinal cord. She tells me this has substantially reduced her pain.   UTD on Mammogram. History of dense breast tissue with need for repeat evaluation. Recent biopsy was normal.  Week before christmas hives all over her hands. Lasted a week and ahalf and then stopped. Very itchy and painful. No changes in environment or product use. Resolution was spontaneous. Unsure of what caused this.   History reviewed and reveals the following: Past Medical History:  Diagnosis Date   Abnormal EKG    DDD (degenerative  disc disease), lumbar    LBBB (left bundle branch block)    Lumbago with sciatica    Paresthesia of lower lip    Rheumatoid arthritis ([place])    Scleritis    Vitamin D deficiency    Past Surgical History:  Procedure Laterality Date   CESAREAN SECTION  1995   LAMINECTOMY     2019   LAPAROSCOPIC HYSTERECTOMY     3846   NISSEN FUNDOPLICATION  6599   [facility]     2021   Family Status  Relation Name Status   Mother  (Not Specified)   Brother  Deceased   MGM  (Not Specified)   Family History  Problem Relation Age of Onset   Rheum arthritis Mother    Coronary artery disease Brother        found at autopsy after committing suicide   Suicidality Brother 49       2013   Rheum arthritis Maternal Grandmother    Social History   Socioeconomic History   Marital status: Divorced    Spouse name: Not on file   Number of children: Not on file   Years of education: Not on file   Highest education level: Not on file  Occupational History   Not on file  Tobacco Use   Smoking status: Former    Types: Cigarettes    Passive exposure: Past   Smokeless tobacco: Never  Vaping Use  Vaping Use: Never used  Substance and Sexual Activity   Alcohol use: Never   Drug use: Never   Sexual activity: Not Currently    Birth control/protection: Surgical  Other Topics Concern   Not on file  Social History Narrative   Not on file   Social Determinants of Health   Financial Resource Strain: Not on file  Food Insecurity: Not on file  Transportation Needs: Not on file  Physical Activity: Not on file  Stress: Not on file  Social Connections: Not on file   Outpatient Medications Prior to Visit  Medication Sig Note   Cholecalciferol (VITAMIN D) 125 MCG (5000 UT) CAPS Take 1 capsule by mouth 3 (three) times a week.    cycloSPORINE (RESTASIS) 0.05 % ophthalmic emulsion Place 1 drop into both eyes daily at 12 noon.    inFLIXimab-axxq (AVSOLA) 100 MG injection Inject 100  mg into the vein every 6 (six) weeks.    leflunomide (ARAVA) 20 MG tablet Take 20 mg by mouth daily.    Multiple Vitamin (MULTIVITAMIN ADULT PO) Take by mouth.    Probiotic Product (PROBIOTIC PO) Take by mouth. (Patient not taking: Reported on 11/15/2022)    SUMAtriptan 6 MG/0.5ML SOAJ Inject into the skin daily. (Patient not taking: Reported on 11/15/2022) 11/15/2022: prn   [DISCONTINUED] cycloSPORINE (RESTASIS) 0.05 % ophthalmic emulsion Place 1 drop into both eyes 2 (two) times daily.    [DISCONTINUED] fluticasone (FLONASE) 50 MCG/ACT nasal spray Place into both nostrils daily.    [DISCONTINUED] metoprolol tartrate (LOPRESSOR) 100 MG tablet Take 1 tablet (100 mg total) by mouth once for 1 dose. Take 1 tablet (100 mg total) two hours prior to CT scan.    [DISCONTINUED] Vitamin D, Ergocalciferol, (DRISDOL) 1.25 MG (50000 UNIT) CAPS capsule Take 50,000 Units by mouth every 7 (seven) days.    No facility-administered medications prior to visit.   Allergies  Allergen Reactions   Erythromycin Nausea And Vomiting   Nabumetone    Penicillins Hives    There is no immunization history on file for this patient.  Health Maintenance Due Health Maintenance Topics with due status: Overdue     Topic Date Due   HIV Screening Never done   Hepatitis C Screening Never done   DTaP/Tdap/Td Never done   PAP SMEAR-Modifier Never done   COLONOSCOPY (Pts 45-32yrs Insurance coverage will need to be confirmed) Never done    Review of Systems All review of systems negative except what is listed in the HPI   Objective    BP 130/80   Pulse 76   Ht 4\' 11"  (1.499 m)   Wt 154 lb (69.9 kg)   BMI 31.10 kg/m  Physical Exam Vitals and nursing note reviewed.  Constitutional:      General: She is not in acute distress.    Appearance: Normal appearance.  Eyes:     Extraocular Movements: Extraocular movements intact.     Conjunctiva/sclera: Conjunctivae normal.     Pupils: Pupils are equal, round, and reactive  to light.  Neck:     Vascular: No carotid bruit.  Cardiovascular:     Rate and Rhythm: Normal rate and regular rhythm.     Pulses: Normal pulses.     Heart sounds: Normal heart sounds. No murmur heard. Pulmonary:     Effort: Pulmonary effort is normal.     Breath sounds: Normal breath sounds. No wheezing.  Abdominal:     General: Bowel sounds are normal.     Palpations:  Abdomen is soft.  Musculoskeletal:        General: Normal range of motion.     Cervical back: Normal range of motion.     Right lower leg: No edema.     Left lower leg: No edema.  Skin:    General: Skin is warm and dry.     Capillary Refill: Capillary refill takes less than 2 seconds.  Neurological:     General: No focal deficit present.     Mental Status: She is alert and oriented to person, place, and time.  Psychiatric:        Mood and Affect: Mood normal.        Behavior: Behavior normal.        Thought Content: Thought content normal.        Judgment: Judgment normal.     No results found for any visits on 11/15/22.  Assessment & Plan      Problem List Items Addressed This Visit   None Visit Diagnoses     Hemorrhoids, unspecified hemorrhoid type    -  Primary   Relevant Medications   nifedipine 0.3 % ointment        Return if symptoms worsen or fail to improve.      [facility], Sung Amabile, NP, DNP, AGNP-C [facility]

## 2022-11-20 DIAGNOSIS — K589 Irritable bowel syndrome without diarrhea: Secondary | ICD-10-CM | POA: Insufficient documentation

## 2022-11-20 DIAGNOSIS — E7800 Pure hypercholesterolemia, unspecified: Secondary | ICD-10-CM | POA: Insufficient documentation

## 2022-11-20 NOTE — Assessment & Plan Note (Signed)
Chronic with repeated exacerbation despite intervention. Internal at this time. She is not interested in surgical intervention. We discussed medication options that may be helpful including topical nifedipine and nitroglycerine. Given the side effects of nitro, joint decision to trial topical nifedipine to see if this is helpful for symptom management. She may benefit from banding with evaluation with GI if this is not effective. F/U if no improvement after 6 weeks.

## 2023-01-27 IMAGING — CT CT L SPINE W/O CM
1 of 6 series · 7 of 14 positions shown, 9 images · non-contrast
Comparison: 03/12/2022 MRI

CLINICAL DATA: Post laminectomy. Bilateral sciatica. History of
spinal cord stimulator



[Series 3: l spine soft · axial · 0.40mm/px · z∈[-495,-303]mm · 7 of 129 slices shown, 9 images]
[im 17/129  soft-tissue]
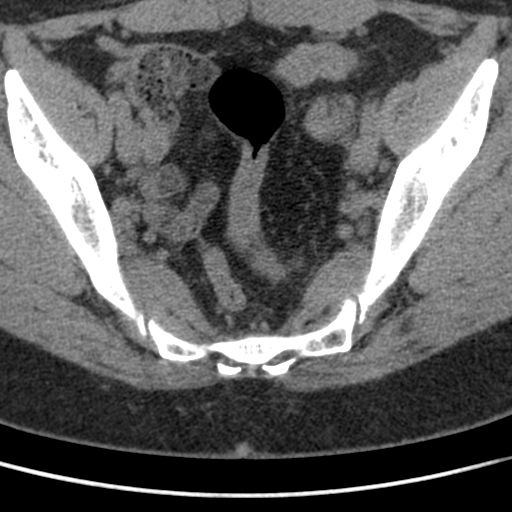
[im 17/129  bone]
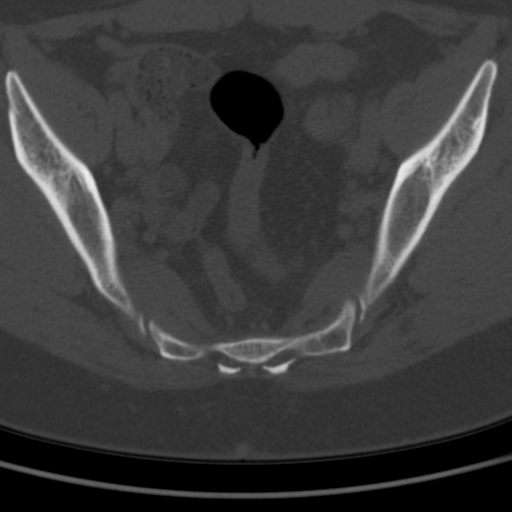
[im 33/129  bone]
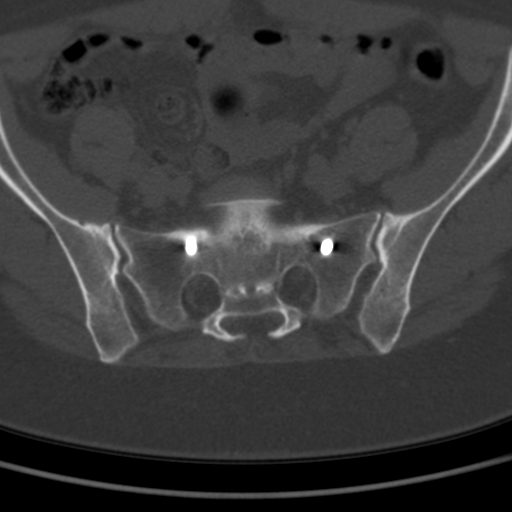
[im 49/129  bone]
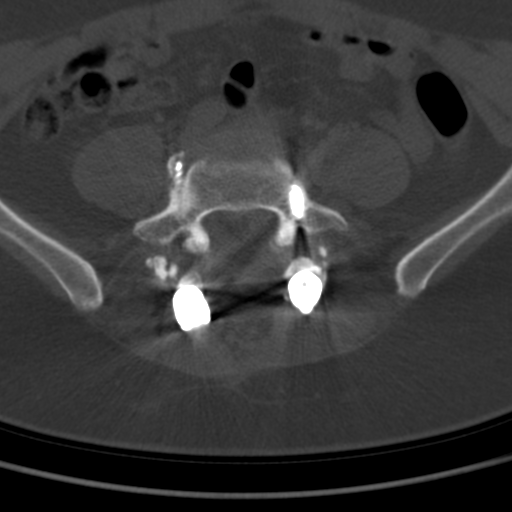
[im 65/129  bone]
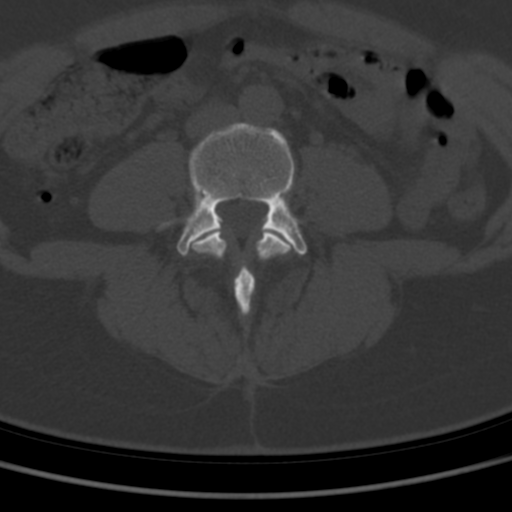
[im 81/129  soft-tissue]
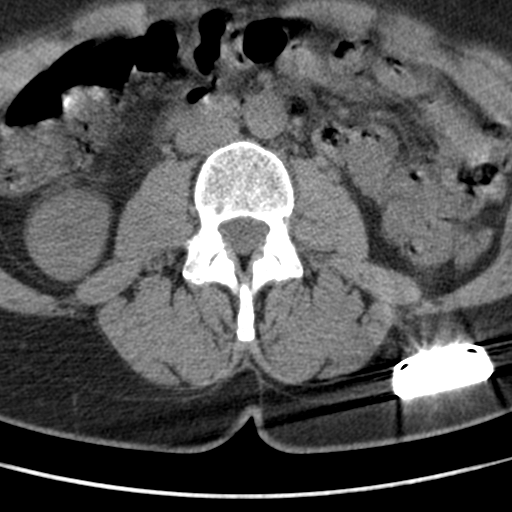
[im 81/129  bone]
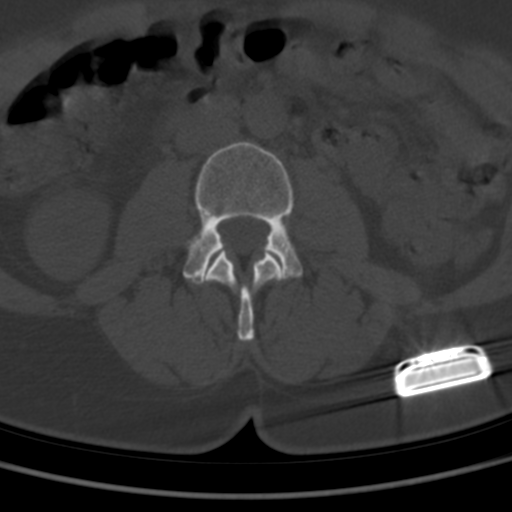
[im 97/129  bone]
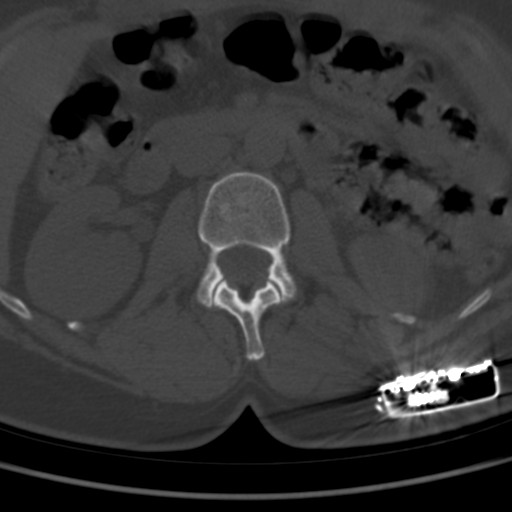
[im 113/129  bone]
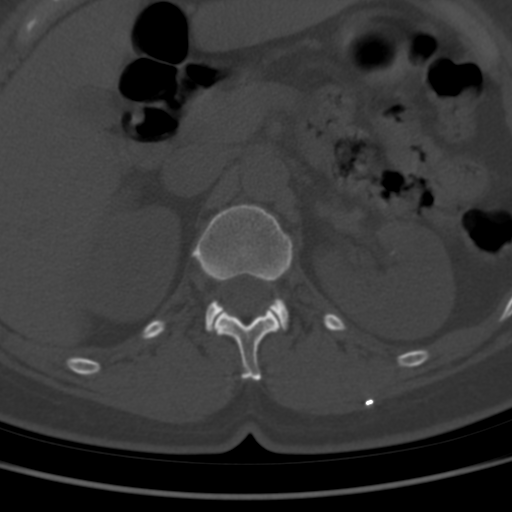

[7 of 14 positions shown; findings below may reference images not displayed]

FINDINGS: Segmentation: 5 lumbar type vertebrae

Alignment: Fused L5-S1 anterolisthesis.

Vertebrae: L5 chronic pars defects with L5-S1 PLIF. No solid fusion
is seen and there is lucency around the right L5 pedicle screw with
reactive bone formation at the tip which extends beyond bone. No
acute fracture or discitis changes.

Paraspinal and other soft tissues: Negative for perispinal mass or
inflammation. 3.5 cm cystic change in the left ovary which could be
adjacent or septated cyst. Two punctate left renal calculi.

Disc levels: L4-5 mild disc space narrowing and bulging. Mild L4-5
facet spurring.

L5-S1 PLIF. Narrow disc space with crowding of the bilateral
foramina, but patent based on MRI.
IMPRESSION: 1. L5-S1 PLIF. No bridging bone and there is loosening of the right
L5 pedicle screw.
2. L4-5 disc bulging and facet spurring without neural compression.
3. Cystic change in the left ovary which could be adjacent cysts or
complicated/septated cyst. Recommend pelvic ultrasound in 6-12
weeks.

## 2023-01-31 ENCOUNTER — Encounter: Payer: Self-pay | Admitting: Nurse Practitioner

## 2023-01-31 ENCOUNTER — Ambulatory Visit: Payer: Commercial Managed Care - HMO | Admitting: Nurse Practitioner

## 2023-01-31 VITALS — BP 122/74 | HR 82 | Wt 160.8 lb

## 2023-01-31 DIAGNOSIS — H6505 Acute serous otitis media, recurrent, left ear: Secondary | ICD-10-CM

## 2023-01-31 DIAGNOSIS — Z6832 Body mass index (BMI) 32.0-32.9, adult: Secondary | ICD-10-CM

## 2023-01-31 DIAGNOSIS — E7800 Pure hypercholesterolemia, unspecified: Secondary | ICD-10-CM

## 2023-01-31 MED ORDER — FLUCONAZOLE 150 MG PO TABS: 150.0000 mg | ORAL_TABLET | Freq: Once | ORAL | 2 refills | Status: AC

## 2023-01-31 MED ORDER — ZEPBOUND 2.5 MG/0.5ML ~~LOC~~ SOAJ: 2.5000 mg | SUBCUTANEOUS | 0 refills | Status: DC

## 2023-01-31 MED ORDER — PREDNISONE 20 MG PO TABS: 40.0000 mg | ORAL_TABLET | Freq: Every day | ORAL | 0 refills | Status: DC

## 2023-01-31 MED ORDER — CEFDINIR 300 MG PO CAPS: 300.0000 mg | ORAL_CAPSULE | Freq: Two times a day (BID) | ORAL | 0 refills | Status: DC

## 2023-01-31 NOTE — Patient Instructions (Addendum)
If you are not feeling any better by the time you finish the antibiotic, please let me know.   I will work to get authorization for the Zepbound. If we get this approved, I can send you information on the coupon and how to best use it. :-)  WEIGHT LOSS PLANNING  For best management of weight, it is vital to balance intake versus output. This means the number of calories burned per day must be less than the calories you take in with food and drink.   I recommend trying to follow a diet with the following: Calories: 1200-1500 calories per day Carbohydrates: 150-180 grams of carbohydrates per day  Why: Gives your body enough "quick fuel" for cells to maintain normal function without sending them into starvation mode.  Protein: At least 90 grams of protein per day- 30 grams with each meal Why: Protein takes longer and uses more energy than carbohydrates to break down for fuel. The carbohydrates in your meals serves as quick energy sources and proteins help use some of that extra quick energy to break down to produce long term energy. This helps you not feel hungry as quickly and protein breakdown burns calories.  Water: Drink AT LEAST 64 ounces of water per day  Why: Water is essential to healthy metabolism. Water helps to fill the stomach and keep you fuller longer. Water is required for healthy digestion and filtering of waste in the body.  Fat: Limit fats in your diet- when choosing fats, choose foods with lower fats content such as lean meats (chicken, fish, [place]).  Why: Increased fat intake leads to storage "for later". Once you burn your carbohydrate energy, your body goes into fat and protein breakdown mode to help you loose weight.  Cholesterol: Fats and oils that are LIQUID at room temperature are best. Choose vegetable oils (olive oil, avocado oil, nuts). Avoid fats that are SOLID at room temperature (animal fats, processed meats). Healthy fats are often found in whole grains, beans, nuts,  seeds, and berries.  Why: Elevated cholesterol levels lead to build up of cholesterol on the inside of your blood vessels. This will eventually cause the blood vessels to become hard and can lead to high blood pressure and damage to your organs. When the blood flow is reduced, but the pressure is high from cholesterol buildup, parts of the cholesterol can break off and form clots that can go to the brain or heart leading to a stroke or heart attack.  Fiber: Increase amount of SOLUBLE the fiber in your diet. This helps to fill you up, lowers cholesterol, and helps with digestion. Some foods high in soluble fiber are oats, peas, beans, apples, carrots, barley, and citrus fruits.   Why: Fiber fills you up, helps remove excess cholesterol, and aids in healthy digestion which are all very important in weight management.   I recommend the following as a minimum activity routine: Purposeful walk or other physical activity at least 20 minutes every single day. This means purposefully taking a walk, jog, bike, swim, treadmill, elliptical, dance, etc.  This activity should be ABOVE your normal daily activities, such as walking at work. Goal exercise should be at least 150 minutes a week- work your way up to this.   Heart Rate: Your maximum exercise heart rate should be 220 - Your Age in Years. When exercising, get your heart rate up, but avoid going over the maximum targeted heart rate.  60-70% of your maximum heart rate is where you  tend to burn the most fat. To find this number:  220 - Age In Years= Max HR  Max HR x 0.6 (or 0.7) = Fat Burning HR The Fat Burning HR is your goal heart rate while working out to burn the most fat.  NEVER exercise to the point your feel lightheaded, weak, nauseated, dizzy. If you experience ANY of these symptoms- STOP exercise! Allow yourself to cool down and your heart rate to come down. Then restart slower next time.  If at [place] you feel chest pain or chest pressure  during exercise, STOP IMMEDIATELY and seek medical attention.

## 2023-01-31 NOTE — Progress Notes (Signed)
Orma Render, DNP, AGNP-c [place], [place] [address] [phone]  Subjective:   Nancy [name] is a 47 y.o. female presents to day for evaluation of: Nancy [name] presents today with a chief complaint of severe ear pain, which she suspects may be due to an ear infection. She notes that the onset of pain was yesterday and that it radiates down her neck. Additionally, the patient has a history of a perforated eardrum that intermittently heals and reopens. She also reports recent symptoms of a head cold, which have shown improvement with the use of over-the-counter medications Dayquil and Sudafed.  While the patient is uncertain about the presence of a fever due to a non-functional thermometer, she does report experiencing a headache and some throat soreness, although she clarifies that her throat is not severely affected. She has a documented allergy to penicillin but has previously tolerated cephalosporins without issue.  In terms of her medical history, the patient mentions that she has occasionally required steroids to manage ear infections, although not consistently. She states that she does not usually develop yeast infections following antibiotic use but requests a prescription for Diflucan as a preventive measure.   The patient expresses interest in discussing weight loss medication options, referencing past success with an injectable medication that was ultimately discontinued by her insurance.  PMH, Medications, and Allergies reviewed and updated in chart as appropriate.   ROS negative except for what is listed in HPI. Objective:  BP 122/74   Pulse 82   Wt 160 lb 12.8 oz (72.9 kg)   BMI 32.48 kg/m  Physical Exam Vitals and nursing note reviewed.  Constitutional:      Appearance: She is ill-appearing.  HENT:     Head: Normocephalic.     Right Ear: Drainage and tenderness present. No mastoid tenderness. Tympanic membrane is  perforated.     Left Ear: Tenderness present. No drainage. A middle ear effusion is present. No mastoid tenderness. Tympanic membrane is bulging. Tympanic membrane is not perforated.     Nose: Congestion present.     Mouth/Throat:     Pharynx: Posterior oropharyngeal erythema present.  Eyes:     Pupils: Pupils are equal, round, and reactive to light.  Cardiovascular:     Rate and Rhythm: Normal rate and regular rhythm.     Pulses: Normal pulses.     Heart sounds: Normal heart sounds.  Pulmonary:     Effort: Pulmonary effort is normal.     Breath sounds: Normal breath sounds.  Musculoskeletal:        General: Normal range of motion.     Cervical back: Normal range of motion.  Lymphadenopathy:     Cervical: Cervical adenopathy present.  Skin:    General: Skin is warm and dry.     Capillary Refill: Capillary refill takes less than 2 seconds.  Neurological:     General: No focal deficit present.     Mental Status: She is alert and oriented to person, place, and time.  Psychiatric:        Mood and Affect: Mood normal.           Assessment & Plan:   Problem List Items Addressed This Visit     Recurrent acute serous otitis media of left ear - Primary    Perforation with purulent drainage noted in the left canal consistent with OM with rupture. Perforation visualized.  Plan: - Administer Cefdinir ([place]) for 7 days, with discretion to stop  at day 5 based on patient's symptomatology. - Advise the use of a cotton ball in the ear during showers. - Recommend alternating Tylenol and ibuprofen for pain relief. - Consider steroids if no improvement is observed within 24 hours post-antibiotic commencement.      Relevant Medications   predniSONE (DELTASONE) 20 MG tablet   BMI 32.0-32.9,adult    The patient has expressed an interest in pharmacological interventions for weight loss. A discussion regarding the various medication options available, including Phentermine, Contrave,  Qsymia, and injectable medications, has taken place.  Plan: A prior authorization for Zepbound has been submitted, and the patient will be informed about insurance coverage and potential costs.       Relevant Medications   tirzepatide (ZEPBOUND) 2.5 MG/0.5ML Pen   Hypercholesterolemia   Relevant Medications   tirzepatide (ZEPBOUND) 2.5 MG/0.5ML Pen      Orma Render, DNP, AGNP-c 02/09/2023  9:11 PM    History, Medications, Surgery, SDOH, and Family History reviewed and updated as appropriate.

## 2023-02-08 ENCOUNTER — Ambulatory Visit: Payer: Commercial Managed Care - HMO | Admitting: Nurse Practitioner

## 2023-02-08 VITALS — BP 122/90 | HR 72 | Wt 161.2 lb

## 2023-02-08 DIAGNOSIS — T7840XA Allergy, unspecified, initial encounter: Secondary | ICD-10-CM

## 2023-02-08 DIAGNOSIS — H6505 Acute serous otitis media, recurrent, left ear: Secondary | ICD-10-CM | POA: Diagnosis not present

## 2023-02-08 MED ORDER — DOXYCYCLINE HYCLATE 100 MG PO TABS: 100.0000 mg | ORAL_TABLET | Freq: Two times a day (BID) | ORAL | 0 refills | Status: DC

## 2023-02-08 MED ORDER — LEVOCETIRIZINE DIHYDROCHLORIDE 5 MG PO TABS: 5.0000 mg | ORAL_TABLET | Freq: Every evening | ORAL | 3 refills | Status: DC

## 2023-02-08 NOTE — Progress Notes (Signed)
  Tollie Eth, DNP, AGNP-c [facility] [address] [place], [place] [address] [phone]  Subjective:   Nancy [name] is a 47 y.o. female presents to day for evaluation of: Ear pain in the left ear. She has a history of recurrent otitis media that has been treated several times. She endorses pressure and pain in the ear and into the neck.   PMH, Medications, and Allergies reviewed and updated in chart as appropriate.   ROS negative except for what is listed in HPI. Objective:  BP (!) 122/90 (BP Location: Left Arm, Patient Position: Sitting, Cuff Size: Normal)   Pulse 72   Wt 161 lb 3.2 oz (73.1 kg)   BMI 32.56 kg/m  Physical Exam Vitals and nursing note reviewed.  Constitutional:      Appearance: Normal appearance.  HENT:     Head: Normocephalic.     Right Ear: A middle ear effusion is present. Tympanic membrane is retracted. Tympanic membrane is not bulging.     Left Ear: Tenderness present. A middle ear effusion is present. Tympanic membrane is erythematous and bulging.     Nose:     Right Turbinates: Swollen.     Left Turbinates: Swollen.     Right Sinus: No maxillary sinus tenderness or frontal sinus tenderness.     Left Sinus: No maxillary sinus tenderness or frontal sinus tenderness.     Mouth/Throat:     Mouth: Mucous membranes are moist.     Pharynx: Oropharynx is clear.  Cardiovascular:     Rate and Rhythm: Normal rate and regular rhythm.     Pulses: Normal pulses.     Heart sounds: Normal heart sounds.  Pulmonary:     Effort: Pulmonary effort is normal.     Breath sounds: Normal breath sounds.  Musculoskeletal:     Cervical back: Normal range of motion.  Lymphadenopathy:     Cervical: Cervical adenopathy present.  Skin:    General: Skin is warm and dry.  Neurological:     Mental Status: She is alert.           Assessment & Plan:   Problem List Items Addressed This Visit     Recurrent acute serous otitis media of left  ear - Primary    Symptoms and presentation consistent with repeat of otitis media on the left with serous effusion on the right. Strongly suspect that allergies are contributing to the ongoing symptoms as she does have pale, boggy turbinates present.  Plan: - treatment with doxycycline and xyzal - consider ent referral if this persists.       Relevant Medications   doxycycline (VIBRA-TABS) 100 MG tablet   Allergies    Allergy symptoms present with recurrence of otitis media, most like related.  Plan: -state xyzal nightly - consider flonase or similar steroid nasal spray daily to help reduce inflammation in the sinuses.       Relevant Medications   levocetirizine (XYZAL) 5 MG tablet      Tollie Eth, DNP, AGNP-c 02/16/2023  7:52 AM    History, Medications, Surgery, SDOH, and Family History reviewed and updated as appropriate.

## 2023-02-09 DIAGNOSIS — H6505 Acute serous otitis media, recurrent, left ear: Secondary | ICD-10-CM | POA: Insufficient documentation

## 2023-02-09 DIAGNOSIS — Z6832 Body mass index (BMI) 32.0-32.9, adult: Secondary | ICD-10-CM | POA: Insufficient documentation

## 2023-02-09 NOTE — Assessment & Plan Note (Signed)
The patient has expressed an interest in pharmacological interventions for weight loss. A discussion regarding the various medication options available, including Phentermine, Contrave, Qsymia, and injectable medications, has taken place.  Plan: A prior authorization for Zepbound has been submitted, and the patient will be informed about insurance coverage and potential costs.

## 2023-02-09 NOTE — Assessment & Plan Note (Signed)
Perforation with purulent drainage noted in the left canal consistent with OM with rupture. Perforation visualized.  Plan: - Administer Cefdinir ([place]) for 7 days, with discretion to stop at day 5 based on patient's symptomatology. - Advise the use of a cotton ball in the ear during showers. - Recommend alternating Tylenol and ibuprofen for pain relief. - Consider steroids if no improvement is observed within 24 hours post-antibiotic commencement.

## 2023-02-16 DIAGNOSIS — T7840XA Allergy, unspecified, initial encounter: Secondary | ICD-10-CM | POA: Insufficient documentation

## 2023-02-16 NOTE — Assessment & Plan Note (Signed)
Allergy symptoms present with recurrence of otitis media, most like related.  Plan: -state xyzal nightly - consider flonase or similar steroid nasal spray daily to help reduce inflammation in the sinuses.

## 2023-02-16 NOTE — Assessment & Plan Note (Signed)
Symptoms and presentation consistent with repeat of otitis media on the left with serous effusion on the right. Strongly suspect that allergies are contributing to the ongoing symptoms as she does have pale, boggy turbinates present.  Plan: - treatment with doxycycline and xyzal - consider ent referral if this persists.

## 2023-06-05 ENCOUNTER — Other Ambulatory Visit: Payer: Self-pay | Admitting: Nurse Practitioner

## 2023-06-05 DIAGNOSIS — T7840XA Allergy, unspecified, initial encounter: Secondary | ICD-10-CM

## 2023-10-04 ENCOUNTER — Ambulatory Visit: Payer: Commercial Managed Care - HMO | Admitting: Nurse Practitioner

## 2023-10-04 ENCOUNTER — Encounter: Payer: Self-pay | Admitting: Nurse Practitioner

## 2023-10-04 VITALS — BP 128/82 | HR 84 | Wt 157.0 lb

## 2023-10-04 DIAGNOSIS — E162 Hypoglycemia, unspecified: Secondary | ICD-10-CM | POA: Diagnosis not present

## 2023-10-04 DIAGNOSIS — H65192 Other acute nonsuppurative otitis media, left ear: Secondary | ICD-10-CM

## 2023-10-04 MED ORDER — DOXYCYCLINE HYCLATE 100 MG PO TABS: 100.0000 mg | ORAL_TABLET | Freq: Two times a day (BID) | ORAL | 0 refills | Status: DC

## 2023-10-04 MED ORDER — FREESTYLE LIBRE 3 SENSOR MISC: 1 refills | Status: DC

## 2023-10-04 MED ORDER — FLUCONAZOLE 150 MG PO TABS: ORAL_TABLET | ORAL | 2 refills | Status: DC

## 2023-10-04 NOTE — Patient Instructions (Signed)
Hypoglycemia Hypoglycemia is when the amount of sugar, or glucose, in your blood is too low. Low blood sugar can happen if you have diabetes or if you don't have diabetes. It may be an emergency. What are the causes? Low blood sugar happens most often in people who have diabetes. It may be caused by: Diabetes medicine. Not eating enough, or not eating often enough. Being more active than normal. If you don't have diabetes, you may still get low blood sugar if: There's a tumor in your pancreas. A tumor is a growth of cells that isn't normal. You don't eat enough, or you fast. Fasting is when you don't eat for long periods at a time. You have a bad infection or illness. You have problems after weight loss surgery. You have kidney or liver problems. You take certain medicines. What increases the risk? You're more likely to have low blood sugar if: You have diabetes and take medicine for it. You drink a lot of alcohol. You get sick. What are the signs or symptoms? Mild Hunger or feeling like you may vomit. Sweating and feeling cold to the touch. Feeling dizzy or light-headed. Being sleepy or having trouble sleeping. A headache. Blurry vision. Mood changes. These include feeling worried, nervous, or easily annoyed. Moderate Feeling confused. Changes in the way you act. Weakness. An uneven heartbeat. Very bad Having very low blood sugar is an emergency. It can cause: Fainting. Seizures. A coma. Death. How is this diagnosed?  Low blood sugar can be found with a blood test. This test tells you how much sugar is in your blood. It's done while you're having symptoms. Your health care provider may also do an exam and look at your medical history. How is this treated? Treating low blood sugar If you have low blood sugar, eat or drink something with sugar in it right away. The food or drink should have 15 grams of a fast-acting carbohydrate (carb). Options include: 4 oz (120 mL) of  fruit juice. 4 oz (120 mL) of soda (not diet soda). A few pieces of hard candy. Check food labels to see how many pieces to eat. 1 Tbsp (15 mL) of sugar or honey. 4 glucose tablets. 1 tube of glucose gel. Treating low blood sugar if you have diabetes Talk with your provider about how much carb you should take. If you're alert and can swallow safely, you may follow the 15:15 rule: Take 15 grams of a fast-acting carb. Check your blood sugar 15 minutes after you take the carb. If your blood sugar is still at or below 70 mg/dL (3.9 mmol/L), take 15 grams of a carb again. If your blood sugar doesn't go above 70 mg/dL (3.9 mmol/L) after 3 tries, get help right away. After your blood sugar goes back to normal, eat a meal or a snack within 1 hour. Always keep 15 grams of a fast-acting carb with you. This could be: 4 glucose tablets. A few pieces of hard candy. 1 Tbsp (15 mL) of honey or sugar. 1 tube of glucose gel. Treating very low blood sugar If your blood sugar is less than 54 mg/dL (3 mmol/L), it's an emergency. Get help right away. If you can't eat or drink, you will need to be given glucagon. A family member or friend should learn how to check your blood sugar and give you glucagon. Ask your provider if you should keep a glucagon kit at home. You may also need to be treated in a hospital. Follow  these instructions at home: If you have diabetes: Always keep a fast-acting carb (15 grams) with you. Follow your diabetes care plan. Make sure you: Know the symptoms of low blood sugar. Check your blood sugar as often as told. Always check it before and after you exercise. Always check your blood sugar before you drive. Take your medicines as told. Eat on time. Do not skip meals. Share your diabetes care plan with: Your work or school. The people you live with. Wear an alert bracelet or carry a card that says you have diabetes. General instructions If you drink alcohol: Limit how much you  have to: 0-1 drink a day if you're female. 0-2 drinks a day if you're female. Know how much alcohol is in your drink. In the [place] one drink is one 12 oz bottle of beer (355 mL), one 5 oz glass of wine (148 mL), or one 1 oz glass of hard liquor (44 mL). Be sure to eat food when you drink alcohol. Be sure to check your blood sugar after you drink. Alcohol may lead to low blood sugar later. Where to find more information American Diabetes Association (ADA): diabetes.org Contact a health care provider if: You have low blood sugar often. You have diabetes and are having trouble keeping your blood sugar in the right range. Get help right away if: You can't get your blood sugar above 70 mg/dL (3.9 mmol/L) after 3 tries. Your blood sugar is below 54 mg/dL (3 mmol/L). You have a seizure. You faint. These symptoms may be an emergency. Call 911 right away. Do not wait to see if the symptoms will go away. Do not drive yourself to the hospital. This information is not intended to replace advice given to you by your health care provider. Make sure you discuss any questions you have with your health care provider. Document Revised: 01/11/2023 Document Reviewed: 01/11/2023 Elsevier Patient Education  2024 [employer].

## 2023-10-04 NOTE — Progress Notes (Signed)
Tollie Eth, DNP, AGNP-c [facility] [address] [place], [place] [address] [phone]   ACUTE VISIT- ESTABLISHED PATIENT  Blood pressure 128/82, pulse 84, weight 157 lb (71.2 kg).  Subjective:  HPI Nancy [name] is a 47 y.o. female presents to day for evaluation of acute concern(s).   History of Present Illness Nancy [name], a 47 year old individual, presents with a 5-day history of cough, ear pain, and yellow mucus. The symptoms started after exposure to a respiratory illness in her sister's children. The patient denies any associated fever, body aches, or chills. A home COVID-19 test was negative. The cough started a couple of days after the onset of nasal symptoms. The patient also reports left ear pain and drainage, suggesting an ear infection.  The patient has a known allergy to penicillin, azithromycin, and erythromycin, which cause severe sickness. She has previously been treated with doxycycline, which was effective.  In addition to the respiratory symptoms, the patient reports a history of low blood sugar levels without typical symptoms such as shaking or nausea. She recalls an episode where her blood sugar dropped to the low forties after a glucose tolerance test, without any symptoms. Recently, she had a blood sugar level of 59 after eating breakfast, again without any symptoms, taken with her rheumatology office. This asymptomatic hypoglycemia is a concern for the patient.  The patient also mentions a history of a traumatic tooth extraction, after which she experiences pain in the jaw whenever she has an infection. She has been managing her symptoms with mushroom coffee, which she believes has helped with her skin and hair health.  ROS negative except for what is listed in HPI. History, Medications, Surgery, SDOH, and Family History reviewed and updated as appropriate.  Objective:  Physical Exam Vitals and nursing note reviewed.  Constitutional:       General: She is not in acute distress.    Appearance: Normal appearance. She is ill-appearing.  HENT:     Head: Normocephalic.     Right Ear: Hearing normal.     Left Ear: Drainage and tenderness present. A middle ear effusion is present. Tympanic membrane is erythematous.     Ears:     Comments: Purulent effusion present.    Nose:     Right Turbinates: Enlarged and swollen.     Left Turbinates: Enlarged and swollen.     Right Sinus: Maxillary sinus tenderness and frontal sinus tenderness present.     Left Sinus: Maxillary sinus tenderness and frontal sinus tenderness present.     Mouth/Throat:     Pharynx: Uvula midline. Posterior oropharyngeal erythema present. No oropharyngeal exudate or uvula swelling.     Tonsils: No tonsillar exudate or tonsillar abscesses. 2+ on the right. 2+ on the left.  Cardiovascular:     Rate and Rhythm: Normal rate and regular rhythm.     Pulses: Normal pulses.     Heart sounds: Normal heart sounds.  Pulmonary:     Effort: Pulmonary effort is normal. No respiratory distress.     Breath sounds: Normal breath sounds. No wheezing, rhonchi or rales.  Chest:     Chest wall: No tenderness.  Musculoskeletal:     Cervical back: No tenderness.  Lymphadenopathy:     Cervical: Cervical adenopathy present.  Skin:    General: Skin is warm and dry.     Capillary Refill: Capillary refill takes less than 2 seconds.  Neurological:     Mental Status: She is alert and oriented to person, place,  and time.  Psychiatric:        Mood and Affect: Mood normal.        Behavior: Behavior normal.         Assessment & Plan:   Problem List Items Addressed This Visit     Acute non-suppurative otitis media, left - Primary    [name] presents with a 5-day history of cough, ear pain, and yellow mucus. COVID-19 test was negative. Examination reveals severe left ear infection consistent with otitis media. No fever, body aches, or chills. Lungs are clear, indicating no  pneumonia. Discussed doxycycline due to allergies to penicillin, azithromycin, and erythromycin. Informed about potential yeast infection and preventive use of Diflucan. - Prescribe doxycycline - Prescribe Diflucan for potential yeast infection       Relevant Medications   doxycycline (VIBRA-TABS) 100 MG tablet   fluconazole (DIFLUCAN) 150 MG tablet   Hypoglycemia, unspecified    Eliabeth reports low blood sugar levels, with a recent reading of 59 mg/dL postprandial. No symptoms during the episode. Previous glucose tolerance test showed low blood sugar. Etiology unclear; further investigation needed for potential insulin overproduction or other causes. Discussed continuous glucose monitoring (Freestyle) for tracking trends and alerts. Explained need for insulin and cortisol level tests. - Order insulin level test - Order cortisol level test - Consider continuous glucose monitoring (Freestyle) - Provide information on managing hypoglycemia, including keeping orange juice on hand and the importance of eating protein following the blood sugar stabilization.       Relevant Medications   Continuous Glucose Sensor (FREESTYLE LIBRE 3 SENSOR) MISC   Other Relevant Orders   Hemoglobin A1c   Insulin, Free and Total   Cortisol      Tollie Eth, DNP, AGNP-c Time: 26 bminutes, >50% spent counseling, care coordination, chart review, and documentation.

## 2023-10-04 NOTE — Assessment & Plan Note (Addendum)
Nancy [name] reports low blood sugar levels, with a recent reading of 59 mg/dL postprandial. No symptoms during the episode. Previous glucose tolerance test showed low blood sugar. Etiology unclear; further investigation needed for potential insulin overproduction or other causes. Discussed continuous glucose monitoring (Freestyle) for tracking trends and alerts. Explained need for insulin and cortisol level tests. - Order insulin level test - Order cortisol level test - Consider continuous glucose monitoring (Freestyle) - Provide information on managing hypoglycemia, including keeping orange juice on hand and the importance of eating protein following the blood sugar stabilization.

## 2023-10-04 NOTE — Assessment & Plan Note (Signed)
Nancy [name] presents with a 5-day history of cough, ear pain, and yellow mucus. COVID-19 test was negative. Examination reveals severe left ear infection consistent with otitis media. No fever, body aches, or chills. Lungs are clear, indicating no pneumonia. Discussed doxycycline due to allergies to penicillin, azithromycin, and erythromycin. Informed about potential yeast infection and preventive use of Diflucan. - Prescribe doxycycline - Prescribe Diflucan for potential yeast infection

## 2023-10-13 ENCOUNTER — Ambulatory Visit: Payer: Commercial Managed Care - HMO | Admitting: Medical

## 2023-10-13 VITALS — BP 110/70 | HR 81 | Temp 97.0°F | Wt 158.2 lb

## 2023-10-13 DIAGNOSIS — H6643 Suppurative otitis media, unspecified, bilateral: Secondary | ICD-10-CM

## 2023-10-13 MED ORDER — NEOMYCIN-POLYMYXIN-HC 3.5-10000-1 OT SOLN: 3.0000 [drp] | Freq: Three times a day (TID) | OTIC | 0 refills | Status: DC

## 2023-10-13 MED ORDER — CEFDINIR 300 MG PO CAPS: 300.0000 mg | ORAL_CAPSULE | Freq: Two times a day (BID) | ORAL | 0 refills | Status: DC

## 2023-10-13 MED ORDER — PSEUDOEPHEDRINE HCL 60 MG PO TABS: 60.0000 mg | ORAL_TABLET | Freq: Two times a day (BID) | ORAL | 0 refills | Status: DC

## 2023-10-13 NOTE — Progress Notes (Signed)
Subjective:  Nancy [name] is a 47 y.o. female who presents for Chief Complaint  Patient presents with   Ear Drainage    Left ear drainage, can't really hear out of it. Just finished antibiotic yesterday     Here for ear infection.  Having ongoing left ear pain, some drainage intermittent coming out of the ear.   Was seen last week here for same, has just finished 7 day course of doxycycline.   Has some cough.  No sore throat, no fever.  Has slight headache.  Has some nausea.   No dizziness, no vomiting.  No wheezing.  Had nasal congestion but that improved. Is using her routine allergy medication.  Nonsmoker.  No other aggravating or relieving factors.    No other c/o.  Past Medical History:  Diagnosis Date   Abnormal EKG    DDD (degenerative disc disease), lumbar    LBBB (left bundle branch block)    Lumbago with sciatica    Paresthesia of lower lip    Rheumatoid arthritis (HCC)    Scleritis    Vitamin D deficiency    Current Outpatient Medications on File Prior to Visit  Medication Sig Dispense Refill   Cholecalciferol (VITAMIN D) 125 MCG (5000 UT) CAPS Take 1 capsule by mouth 3 (three) times a week.     Continuous Glucose Sensor (FREESTYLE LIBRE 3 SENSOR) MISC Place 1 sensor on the skin every 14 days. Use to check glucose continuously 0.66 each 1   cycloSPORINE (RESTASIS) 0.05 % ophthalmic emulsion Place 1 drop into both eyes daily at 12 noon.     inFLIXimab-axxq (AVSOLA) 100 MG injection Inject 100 mg into the vein every 6 (six) weeks.     leflunomide (ARAVA) 20 MG tablet Take 20 mg by mouth daily.     levocetirizine (XYZAL) 5 MG tablet TAKE 1 TABLET BY MOUTH EVERY DAY IN THE EVENING 30 tablet 3   Multiple Vitamin (MULTIVITAMIN ADULT PO) Take by mouth.     nifedipine 0.3 % ointment Place 1 Application rectally 4 (four) times daily. 30 g 2   Probiotic Product (PROBIOTIC PO) Take by mouth.     SUMAtriptan 6 MG/0.5ML SOAJ Inject into the skin daily.     doxycycline  (VIBRA-TABS) 100 MG tablet Take 1 tablet (100 mg total) by mouth 2 (two) times daily. (Patient not taking: Reported on 10/13/2023) 14 tablet 0   fluconazole (DIFLUCAN) 150 MG tablet May repeat in 3 days if symptoms not resolved (Patient not taking: Reported on 10/13/2023) 2 tablet 2   No current facility-administered medications on file prior to visit.    The following portions of the patient's history were reviewed and updated as appropriate: allergies, current medications, past family history, past medical history, past social history, past surgical history and problem list.  ROS Otherwise as in subjective above    Objective: BP 110/70   Pulse 81   Temp (!) 97 F (36.1 C)   Wt 158 lb 3.2 oz (71.8 kg)   BMI 31.95 kg/m   General appearance: alert, no distress, well developed, well nourished HEENT: normocephalic, sclerae anicteric, conjunctiva pink and moist, right TM flat, left TM retracted and opaque fluid on the eardrum, nares patent, no discharge or erythema, pharynx normal Oral cavity: MMM, no lesions Neck: supple, no lymphadenopathy, no thyromegaly, no masses Lungs: CTA bilaterally, no wheezes, rhonchi, or rales   Assessment: Encounter Diagnosis  Name Primary?   Suppurative otitis media of both ears, unspecified chronicity Yes  Plan: Begin medications below, good water intake, Tylenol if needed for pain or fever.  Recheck in 7 to 10 days for ear check.  Nancy [name] was seen today for ear drainage.  Diagnoses and all orders for this visit:  Suppurative otitis media of both ears, unspecified chronicity  Other orders -     cefdinir (OMNICEF) 300 MG capsule; Take 1 capsule (300 mg total) by mouth 2 (two) times daily. -     neomycin-polymyxin-hydrocortisone (CORTISPORIN) OTIC solution; Place 3 drops into both ears 3 (three) times daily. -     pseudoephedrine (SUDAFED) 60 MG tablet; Take 1 tablet (60 mg total) by mouth in the morning and at bedtime.    Follow up:  prn

## 2023-10-15 ENCOUNTER — Other Ambulatory Visit: Payer: Self-pay | Admitting: Nurse Practitioner

## 2023-10-15 DIAGNOSIS — T7840XA Allergy, unspecified, initial encounter: Secondary | ICD-10-CM

## 2023-10-20 LAB — HEMOGLOBIN A1C
Est. average glucose Bld gHb Est-mCnc: 100 mg/dL
Hgb A1c MFr Bld: 5.1 % (ref 4.8–5.6)

## 2023-10-20 LAB — INSULIN, FREE AND TOTAL
Free Insulin: 7.4 uU/mL
Total Insulin: 7.5 uU/mL

## 2023-10-20 LAB — CORTISOL: Cortisol: 8.6 ug/dL (ref 6.2–19.4)

## 2023-10-25 ENCOUNTER — Ambulatory Visit: Payer: Commercial Managed Care - HMO | Admitting: Nurse Practitioner

## 2023-10-25 ENCOUNTER — Encounter: Payer: Self-pay | Admitting: Nurse Practitioner

## 2023-10-25 VITALS — BP 124/78 | HR 85 | Wt 158.0 lb

## 2023-10-25 DIAGNOSIS — E162 Hypoglycemia, unspecified: Secondary | ICD-10-CM | POA: Diagnosis not present

## 2023-10-25 DIAGNOSIS — T783XXA Angioneurotic edema, initial encounter: Secondary | ICD-10-CM

## 2023-10-25 DIAGNOSIS — T7840XA Allergy, unspecified, initial encounter: Secondary | ICD-10-CM

## 2023-10-25 DIAGNOSIS — Z881 Allergy status to other antibiotic agents status: Secondary | ICD-10-CM | POA: Diagnosis not present

## 2023-10-25 MED ORDER — HYDROXYZINE PAMOATE 25 MG PO CAPS: 25.0000 mg | ORAL_CAPSULE | Freq: Three times a day (TID) | ORAL | 0 refills | Status: DC | PRN

## 2023-10-25 MED ORDER — CETIRIZINE HCL 10 MG PO TABS: 10.0000 mg | ORAL_TABLET | Freq: Two times a day (BID) | ORAL | 0 refills | Status: DC

## 2023-10-25 MED ORDER — METHYLPREDNISOLONE SODIUM SUCC 125 MG IJ SOLR
125.0000 mg | Freq: Once | INTRAMUSCULAR | Status: AC
  Administered 2023-10-25: 125 mg via INTRAMUSCULAR

## 2023-10-25 MED ORDER — PREDNISONE 20 MG PO TABS: ORAL_TABLET | ORAL | 0 refills | Status: DC

## 2023-10-25 NOTE — Patient Instructions (Addendum)
Cetirizine twice a day (you can substitute levocetirizine) Hydroxyzine as needed for breakthrough itching and bedtime Prednisone start tomorrow If this gets any worse at all go to ED  Stay away from penicillin and cephalosporins.  Angioedema Angioedema is the sudden swelling of tissue in the body. Angioedema can affect any part of the body, including the legs, hands, genitals, face, mouth, lips, and internal organs, like your intestines. Depending on the cause, angioedema may happen just once. However, some people may have repeated bouts of angioedema during their lives. Symptoms may be mild and may occur along with other allergic symptoms such as itchy, red, swollen areas of skin (hives). Severe angioedema can be life-threatening if it affects the air passages and blocks breathing. What are the causes? This condition may be caused by: Foods, such as milk, eggs, shellfish, wheat, or nuts. Certain medicines, such as ACE inhibitors, birth control pills, dyes used in X-rays, or NSAIDs, such as ibuprofen. Hereditary angioedema (HAE) is genetic. Episodes can be triggered by: Illness, infection, or emotional or physical stress. Changes in hormone levels. Exercise. Minor surgical or dental procedures. In some cases, the cause of this condition is not known. What increases the risk? You are more likely to develop HAE if you have family members with this condition.  What are the signs or symptoms?  Symptoms of this condition depend on where the swelling happens.  Symptoms of this condition include: Swollen skin. Hives. Pain, pressure, or tenderness in the affected area. Swollen eyelids, face, lips, or tongue. Trouble drinking, swallowing, or closing the mouth completely. Hoarseness or sore throat. Wheezing or trouble breathing. If your internal organs are affected, symptoms may also include: Nausea. Pain in the abdomen. Vomiting or diarrhea. Trouble swallowing. Trouble passing  urine. How is this diagnosed? This condition may be diagnosed based on: An exam of the affected area. Your medical history. Whether anyone in your family has had this condition before. A review of any medicines you have been taking. Tests, including: Allergy skin tests to see if the condition was caused by an allergic reaction. Blood tests to see if the condition was caused by certain inherited or genetic diseases. How is this treated? Treatment for this condition depends on the cause and severity of your symptoms. It may involve any of the following: Avoiding triggers, if they are known. Triggers may include foods or environmental allergens. Stopping medicines permanently if they cause the condition. These include ACE inhibitors. Taking medicines to treat symptoms or prevent future episodes. These may include: Antihistamines. Epinephrine injections. Steroids. Blood products to treat specific types of non-allergic angioedema. Breathing tubes or ventilators in severe cases in which breathing is affected. Severe cases of angioedema are treated at the hospital. Mild to moderate angioedema usually gets better in 24-48 hours. Follow these instructions at home:  Take over-the-counter and prescription medicines only as told by your health care provider. If you were given medicines for emergency allergy treatment, always carry them with you. This includes epinephrine injector kits. Wear a medical bracelet as told by your health care provider. If something triggers your condition, avoid the trigger. Triggers can be foods, environmental allergens, stress, or exercise. Avoid all medicines that caused your angioedema. This is for your entire life. If your condition is inherited and you are thinking about having children, talk to your health care provider. It is important to discuss the risks of passing on the condition to your children. Where to find more information American Academy of Allergy  Asthma &  Immunology: www.aaaai.org Contact a health care provider if: You continue to have repeated episodes of angioedema. Episodes of angioedema start to happen more often than they used to, even after you take steps to prevent them. You have episodes of angioedema that are more severe than they have been before, even after you take steps to prevent them. You are thinking about having children. Get help right away if: You have severe swelling of your mouth, tongue, or lips. Your swelling gets worse. You have trouble breathing, swallowing, or talking. You have chest pain, dizziness or light-headedness, or you pass out. These symptoms may represent a serious problem that is an emergency. Do not wait to see if the symptoms will go away. Get medical help right away. Call your local emergency services (911 in the [place] Do not drive yourself to the hospital. Summary Angioedema is the sudden swelling of tissues. It is important to be aware of all triggers or causes for your angioedema and to avoid them. Treatment for this condition depends on the cause and severity of your symptoms. Severe angioedema can be life-threatening if it blocks the air passages. This information is not intended to replace advice given to you by your health care provider. Make sure you discuss any questions you have with your health care provider. Document Revised: 02/24/2021 Document Reviewed: 02/24/2021 Elsevier Patient Education  2024 [employer].

## 2023-10-25 NOTE — Assessment & Plan Note (Signed)
Acute allergic reaction with urticaria and angioedema, likely due to cefdinir exposure. Symptoms include rash, lip and eye swelling, and severe itching, beginning 12 hours post-dose. History of penicillin allergy suggests cross-sensitivity. Discussed risks of hypersensitivity reactions and importance of avoiding cephalosporins and penicillins. Recommended medical alert bracelet. - Administer solumedrol injection - Prescribe prednisone 60 mg tapering to 40 mg and 20 mg - Prescribe cetirizine (Zyrtec) BID for 1-2 weeks - Prescribe hydroxyzine 25-50 mg for breakthrough itching, preferably at bedtime - Document cephalosporin allergy in medical record - Advise to avoid penicillins and cephalosporins - Consider medical alert bracelet for antibiotic allergies - Report to the hospital if symptoms worsen at all

## 2023-10-25 NOTE — Assessment & Plan Note (Signed)
Recurrent Nancy [name] morning hypoglycemia, 16 episodes in 30 days, lowest glucose 55 mg/dL. Symptoms persist despite high-protein bedtime snacks. Discussed need for continued monitoring and potential further evaluation. - Continue monitoring blood glucose levels - Consider further evaluation if symptoms persist

## 2023-10-25 NOTE — Progress Notes (Signed)
Tollie Eth, DNP, AGNP-c [facility] [address] [place], [place] [address] [phone]   ACUTE VISIT- ESTABLISHED PATIENT  Blood pressure 124/78, pulse 85, weight 158 lb (71.7 kg), SpO2 98%.  Subjective:  HPI Nancy [name] is a 47 y.o. female presents to day for evaluation of acute concern(s).  Heli presented with a severe allergic reaction that started approximately 24 hours prior to the consultation. The initial symptom was a rash on one side of the body, which later spread to the other side. The rash was described as itchy and was accompanied by swelling of the lips and eyes. The patient reported waking up in the middle of the night due to the severity of the itchiness. Despite taking Benadryl, the symptoms did not improve and continued to worsen. As of now, the rash and swelling have spread to the face with significant angioedema, but no reported shortness of breath or feeling like she cannot breathe.   The patient had recently completed a course of cefdinir prescribed for an ear infection. This was the second round of antibiotics, following an initial course of doxycycline. The patient reported possibly having taken cefdinir in the past without any adverse reactions but does have a known penicillin allergy with the same symptoms. Given the timing and progression of the symptoms, a delayed hypersensitivity reaction to the cefdinir was suspected.  The patient also reported a history of hypoglycemia, with recent episodes of low blood sugar occurring predominantly at night. These episodes were not related to food intake and occurred regardless of the type of meal consumed before bedtime. The patient did not report any other new symptoms or changes in health status.  ROS negative except for what is listed in HPI. History, Medications, Surgery, SDOH, and Family History reviewed and updated as appropriate.  Objective:  Physical Exam Vitals and nursing note  reviewed.  Constitutional:      Appearance: She is ill-appearing.  Neck:     Vascular: No carotid bruit.  Cardiovascular:     Rate and Rhythm: Normal rate and regular rhythm.     Pulses: Normal pulses.     Heart sounds: Normal heart sounds.  Pulmonary:     Effort: Pulmonary effort is normal. No respiratory distress.     Breath sounds: Normal breath sounds. No stridor. No wheezing or rhonchi.  Musculoskeletal:        General: Normal range of motion.  Lymphadenopathy:     Cervical: No cervical adenopathy.  Skin:    General: Skin is warm and dry.     Capillary Refill: Capillary refill takes less than 2 seconds.     Findings: Rash present. Rash is urticarial.     Comments: Wide spread urticarial rash on chest, back, arms, legs, and abdomen. Edema to the face and hands noted.   Neurological:     General: No focal deficit present.     Mental Status: She is oriented to person, place, and time.  Psychiatric:        Mood and Affect: Mood normal.        Behavior: Behavior normal.          Assessment & Plan:   Problem List Items Addressed This Visit     Angio-edema - Primary   Acute allergic reaction with urticaria and angioedema, likely due to cefdinir exposure. Symptoms include rash, lip and eye swelling, and severe itching, beginning 12 hours post-dose. History of penicillin allergy suggests cross-sensitivity. Discussed risks of hypersensitivity reactions and importance of  avoiding cephalosporins and penicillins. Recommended medical alert bracelet. - Administer solumedrol injection - Prescribe prednisone 60 mg tapering to 40 mg and 20 mg - Prescribe cetirizine (Zyrtec) BID for 1-2 weeks - Prescribe hydroxyzine 25-50 mg for breakthrough itching, preferably at bedtime - Document cephalosporin allergy in medical record - Advise to avoid penicillins and cephalosporins - Consider medical alert bracelet for antibiotic allergies - Report to the hospital if symptoms worsen at all       Relevant Medications   hydrOXYzine (VISTARIL) 25 MG capsule   cetirizine (ZYRTEC) 10 MG tablet   predniSONE (DELTASONE) 20 MG tablet   Allergic drug reaction   Relevant Medications   hydrOXYzine (VISTARIL) 25 MG capsule   cetirizine (ZYRTEC) 10 MG tablet   predniSONE (DELTASONE) 20 MG tablet   Other Visit Diagnoses       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

## 2023-11-01 ENCOUNTER — Other Ambulatory Visit: Payer: Self-pay | Admitting: Nurse Practitioner

## 2023-11-01 DIAGNOSIS — Z881 Allergy status to other antibiotic agents status: Secondary | ICD-10-CM

## 2023-11-01 DIAGNOSIS — T783XXA Angioneurotic edema, initial encounter: Secondary | ICD-10-CM

## 2023-11-01 DIAGNOSIS — T7840XA Allergy, unspecified, initial encounter: Secondary | ICD-10-CM

## 2023-11-16 ENCOUNTER — Other Ambulatory Visit: Payer: Self-pay | Admitting: Nurse Practitioner

## 2023-11-16 DIAGNOSIS — T7840XA Allergy, unspecified, initial encounter: Secondary | ICD-10-CM

## 2023-11-16 DIAGNOSIS — T783XXA Angioneurotic edema, initial encounter: Secondary | ICD-10-CM

## 2023-11-16 DIAGNOSIS — Z881 Allergy status to other antibiotic agents status: Secondary | ICD-10-CM

## 2023-12-26 ENCOUNTER — Ambulatory Visit: Payer: Commercial Managed Care - HMO | Admitting: Family Medicine

## 2023-12-26 ENCOUNTER — Encounter: Payer: Self-pay | Admitting: Family Medicine

## 2023-12-26 VITALS — BP 130/80 | HR 80 | Temp 98.8°F | Ht 59.0 in | Wt 163.0 lb

## 2023-12-26 DIAGNOSIS — H65192 Other acute nonsuppurative otitis media, left ear: Secondary | ICD-10-CM | POA: Diagnosis not present

## 2023-12-26 MED ORDER — CIPROFLOXACIN HCL 500 MG PO TABS: 500.0000 mg | ORAL_TABLET | Freq: Two times a day (BID) | ORAL | 0 refills | Status: DC

## 2023-12-26 NOTE — Progress Notes (Signed)
   Subjective:    Patient ID: Nancy [name], female    DOB: 25-Mar-1976, 48 y.o.   MRN: [id number]  HPI She is here for recheck on continued difficulty with left ear pain.  Review of the record indicates that she was treated with doxycycline which was apparently ineffective.  She was seen again and given the cephalosporin unfortunately had an adverse reaction to that.  She also has allergic reaction to penicillin and erythromycin causes GI related symptoms.   Review of Systems     Objective:    Physical Exam Right TM and canal is normal.  Left TM shows very poor landmarks, slightly retracted and areas of erythema.  Neck is supple without adenopathy.       Assessment & Plan:  Acute non-suppurative otitis media, left - Plan: ciprofloxacin (CIPRO) 500 MG tablet I will treat her for 2 weeks.  Discussed the use of Cipro and possible side effects of the medication including C. difficile.  She is aware of this.  She will return here in 2 weeks for recheck.

## 2024-01-12 ENCOUNTER — Ambulatory Visit: Admitting: Medical

## 2024-01-12 VITALS — BP 110/80 | HR 74 | Wt 161.4 lb

## 2024-01-12 DIAGNOSIS — L989 Disorder of the skin and subcutaneous tissue, unspecified: Secondary | ICD-10-CM

## 2024-01-12 DIAGNOSIS — H6693 Otitis media, unspecified, bilateral: Secondary | ICD-10-CM | POA: Diagnosis not present

## 2024-01-12 DIAGNOSIS — H9202 Otalgia, left ear: Secondary | ICD-10-CM | POA: Diagnosis not present

## 2024-01-12 DIAGNOSIS — H938X3 Other specified disorders of ear, bilateral: Secondary | ICD-10-CM

## 2024-01-12 DIAGNOSIS — J3489 Other specified disorders of nose and nasal sinuses: Secondary | ICD-10-CM

## 2024-01-12 MED ORDER — SULFAMETHOXAZOLE-TRIMETHOPRIM 800-160 MG PO TABS: 1.0000 | ORAL_TABLET | Freq: Two times a day (BID) | ORAL | 0 refills | Status: DC

## 2024-01-12 MED ORDER — PREDNISONE 10 MG PO TABS: ORAL_TABLET | ORAL | 0 refills | Status: DC

## 2024-01-12 MED ORDER — CIPROFLOXACIN-FLUOCINOLONE PF 0.3-0.025 % OT SOLN: 0.2500 mL | Freq: Two times a day (BID) | OTIC | 0 refills | Status: DC

## 2024-01-12 MED ORDER — HYDROCODONE-ACETAMINOPHEN 5-325 MG PO TABS: 1.0000 | ORAL_TABLET | Freq: Two times a day (BID) | ORAL | 0 refills | Status: DC | PRN

## 2024-01-12 MED ORDER — GUAIFENESIN ER 600 MG PO TB12: 600.0000 mg | ORAL_TABLET | Freq: Two times a day (BID) | ORAL | 0 refills | Status: DC

## 2024-01-12 MED ORDER — OFLOXACIN 0.3 % OT SOLN: 5.0000 [drp] | Freq: Two times a day (BID) | OTIC | 0 refills | Status: DC

## 2024-01-12 NOTE — Progress Notes (Signed)
 Subjective:  Nancy [name] is a 48 y.o. female who presents for Chief Complaint  Patient presents with   left ear pain    Left ear pain, doesn't think the infection has gone away. Left side of face feels tingling and having headaches on left side. Thinks its something more than just an ear infection. Been since since 10/04/23 for this.  3rd round of antibiotic     Here for follow-up on ear infection.  She has been dealing with ear issues since November 2024.  She has been on 3-4 different antibiotics by mouth, eardrops, various over-the-counter medicine such as allergy pill, Sudafed and still not really feeling much improved.  She has seen multiple viruses including myself for this.  I saw her in December 2024.  She had a bad reaction to the Ceftin oral antibiotic.  At this point she really just has no improvement and still has some significant ear pain from time to time.  She has been getting little seepage and drainage out of the left ear as well.  No fever body aches or chills.  She has not seen an ENT for this  She also has a skin lesion on the right side of her nose she wants looked at that may need biopsy.  It looks suspicious to her  No other aggravating or relieving factors.    No other c/o.  The following portions of the patient's history were reviewed and updated as appropriate: allergies, current medications, past family history, past medical history, past social history, past surgical history and problem list.  ROS Otherwise as in subjective above    Objective: BP 110/80   Pulse 74   Wt 161 lb 6.4 oz (73.2 kg)   BMI 32.60 kg/m   General appearance: alert, no distress, well developed, well nourished HEENT: normocephalic, sclerae anicteric, conjunctiva pink and moist, ongoing yellowish effusion behind both TMs worse on the left, some erythema of the left TM, there is some seepage in the canal as well, otherwise, nares patent, no discharge or erythema, pharynx  normal Right side of nose proximal third with a 3 mm reddish-brown lesion with some asymmetric characteristics    Assessment: Encounter Diagnoses  Name Primary?   Ear pain, left Yes   Otitis media of both ears follow-up, not resolved    Sinus pressure    Ear pressure, bilateral    Skin lesion      Plan: Ongoing sinus and ear pressure, ear infection chronic-she has tried multiple rounds of treatment since November including oral doxycycline, oral Cipro most recently, oral Ceftin that she had a bad reaction to, eardrops back in December and over-the-counter decongestants and still not improved.  We will continue her daily allergy pill but will add guaifenesin short-term, we will add prednisone oral today, and one of the eardrops below whichever is cheaper and covered by insurance.  Referral urgent to ENT for further evaluation and treatment recommendations.  She can use hydrocodone for worse pain, and can continue alternating Tylenol and ibuprofen over-the-counter for less pain.  If still no improvement within the next 5 days she can add the Bactrim antibiotic but I really want to try to hold off on the oral antibiotic if possible  Skin lesion of nose, suspicious-referral to dermatology   Gicela was seen today for left ear pain.  Diagnoses and all orders for this visit:  Ear pain, left -     Ambulatory referral to ENT  Otitis media of both ears follow-up,  not resolved -     Ambulatory referral to ENT  Sinus pressure -     Ambulatory referral to ENT  Ear pressure, bilateral -     Ambulatory referral to ENT  Skin lesion -     Ambulatory referral to Dermatology  Other orders -     guaiFENesin (MUCINEX) 600 MG 12 hr tablet; Take 1 tablet (600 mg total) by mouth 2 (two) times daily. -     predniSONE (DELTASONE) 10 MG tablet; 6 tablets all together day 1, 5 tablets day 2, 4 tablets day 3, 3 tablets day 4, 2 tablets day 5, 1 tablet day 6. -     ciprofloxacin-fluocinolone PF  (OTOVEL) 0.3-0.025 % SOLN; Place 0.25 mLs into both ears 2 (two) times daily. -     sulfamethoxazole-trimethoprim (BACTRIM DS) 800-160 MG tablet; Take 1 tablet by mouth 2 (two) times daily. -     HYDROcodone-acetaminophen (NORCO/VICODIN) 5-325 MG tablet; Take 1 tablet by mouth 2 (two) times daily as needed for moderate pain (pain score 4-6). -     ofloxacin (FLOXIN) 0.3 % OTIC solution; Place 5 drops into both ears 2 (two) times daily.    Follow up: pending referral

## 2024-01-15 ENCOUNTER — Other Ambulatory Visit (HOSPITAL_COMMUNITY): Payer: Self-pay

## 2024-01-15 ENCOUNTER — Telehealth: Payer: Self-pay

## 2024-01-15 ENCOUNTER — Other Ambulatory Visit: Payer: Self-pay | Admitting: Internal Medicine

## 2024-01-15 NOTE — Telephone Encounter (Signed)
 Pt states that she is on oflacin drops and has been for 3 days. She does not feel any better. She is doing prednisone and has not started the anitbiotic yet. She wants to know when does the ear drops start working so you could see a difference? Does she need to start the antibiotic?

## 2024-01-15 NOTE — Telephone Encounter (Signed)
 Called and left detailed message for pt about drops and if she wants to take antibiotics she can but we would try to get her seen with ENT this week

## 2024-01-15 NOTE — Telephone Encounter (Signed)
 Pharmacy Patient Advocate Encounter  Please see the attachment below. This is the patients pref'd Rx. Please advise on how you would like to proceed?

## 2024-01-18 ENCOUNTER — Ambulatory Visit: Payer: Self-pay | Admitting: Nurse Practitioner

## 2024-01-18 NOTE — Telephone Encounter (Signed)
 Not sure what you want to do. Either offices are not taking her insurance or they will not work her in even with an urgent situation. I have tried [facility] ([facility] doesn't accept insurance), [facility], [facility] in [place], [facility] [place]

## 2024-01-18 NOTE — Telephone Encounter (Signed)
 Copied from CRM [id number]. Topic: Clinical - Red Word Triage >> Jan 18, 2024  9:24 AM Gildardo Pounds wrote: Red Word that prompted transfer to Nurse Triage: Left ear swollen and hurting. Ear drops prescribed making it worse. Callback number is [phone] Reason for Disposition  [1] SEVERE pain AND [2] not improved 2 hours after taking analgesic medication (e.g., ibuprofen or acetaminophen)  Answer Assessment - Initial Assessment Questions 1. LOCATION: "Which ear is involved?"     Left ear is getting worse.   It's swollen all over and hurts to touch.   I'm doing the drops for 5 days.   I didn't use them this morning.   I've had an ear infection since Nov. 2024.    2. ONSET: "When did the ear start hurting"      Nov. 2024. 3. SEVERITY: "How bad is the pain?"  (Scale 1-10; mild, moderate or severe)   - MILD (1-3): doesn't interfere with normal activities    - MODERATE (4-7): interferes with normal activities or awakens from sleep    - SEVERE (8-10): excruciating pain, unable to do any normal activities      Severe 4. URI SYMPTOMS: "Do you have a runny nose or cough?"     No 5. FEVER: "Do you have a fever?" If Yes, ask: "What is your temperature, how was it measured, and when did it start?"     No 6. CAUSE: "Have you been swimming recently?", "How often do you use Q-TIPS?", "Have you had any recent air travel or scuba diving?"     Not asked 7. OTHER SYMPTOMS: "Do you have any other symptoms?" (e.g., headache, stiff neck, dizziness, vomiting, runny nose, decreased hearing)     I have an antibiotic but I've not started taking it.     No drainage form ear.   I'm not going to start the antibiotic until I hear from her.     I'm going out of town in the morning.     8. PREGNANCY: "Is there any chance you are pregnant?" "When was your last menstrual period?"     Not asked  Protocols used: Earache-A-AH  Chief Complaint: Left ear is getting worse.  (Saw Dr. Aleen Campi on 01/12/2024).  It's swollen inside and  out.   Very painful to touch.   Not using the ear drops this morning because it's so painful.   She has not started the antibiotic.   She said she had discussed this with the provider and he is aware.   She is not going to start the antibiotic until hearing back from him.  She has not heard from the ENT that she is supposed to be seeing.    She is leaving town Friday morning so would like a call back today regarding this if possible.   Symptoms: Ear more sore, painful and swollen with ear drops. Frequency: constantly      Been dealing with this ear infection since Nov. 2024 Pertinent Negatives: Patient denies drainage or fever Disposition: [] ED /[] Urgent Care (no appt availability in office) / [] Appointment(In office/virtual)/ []  [facility]/ [] Home Care/ [] Refused Recommended Disposition /[] [facility]/ [x]  Follow-up with PCP Additional Notes: High priority message sent to Dr. Aleen Campi.   Pt. Agreeable to someone calling her back.

## 2024-01-18 NOTE — Telephone Encounter (Signed)
 I have put in urgent request to [facility] to see if we can get her worked in

## 2024-01-19 ENCOUNTER — Other Ambulatory Visit: Payer: Self-pay | Admitting: Medical

## 2024-01-19 MED ORDER — FLUCONAZOLE 100 MG PO TABS: 100.0000 mg | ORAL_TABLET | Freq: Every day | ORAL | 0 refills | Status: DC

## 2024-01-19 NOTE — Addendum Note (Signed)
 Addended by: Herminio Commons A on: 01/19/2024 11:13 AM   Modules accepted: Orders

## 2024-01-19 NOTE — Telephone Encounter (Signed)
 Patient states that she will start antibiotics but she would like fungal medication sent in as well. [facility] to [facility] [place], [place]. She says the swelling in her face hasn't gone down. She is not going to ER out of state and will have to wait until Sunday evening when she gets back. She has not heard from any ENT yet, she has been waiting.

## 2024-01-19 NOTE — Telephone Encounter (Signed)
 Patient states that she is still having ear pain, painful to touch, canal swelling, no drainage or fever, her left side of face is swelling. Jaw hurts, lowe jaw up to temple hurts. She did stop drops as she thought it was making it worse. Pt hasn't started the antibiotic yet. She is out of town so she can't do anything until Monday.  She is out of town. [facility] [place], [place].

## 2024-01-19 NOTE — Telephone Encounter (Signed)
Tried to call pt but unable to leave a message. 

## 2024-01-24 ENCOUNTER — Telehealth: Payer: Self-pay

## 2024-01-24 DIAGNOSIS — K589 Irritable bowel syndrome without diarrhea: Secondary | ICD-10-CM

## 2024-01-24 DIAGNOSIS — K649 Unspecified hemorrhoids: Secondary | ICD-10-CM

## 2024-01-24 NOTE — Telephone Encounter (Signed)
 Did you already discuss with pt. I did not see a reason for referral in this note.   Copied from CRM [id number]. Topic: Referral - Request for Referral >> Jan 24, 2024 10:43 AM Almira Coaster wrote: Did the patient discuss referral with their provider in the last year? Yes (If No - schedule appointment) (If Yes - send message)  Appointment offered? No  Type of order/referral and detailed reason for visit: gastroenterology  Preference of office, provider, location: Anywhere [facility] Early recommends  If referral order, have you been seen by this specialty before? No (If Yes, this issue or another issue? When? Where?  Can we respond through MyChart? Yes >> Jan 24, 2024 10:46 AM Almira Coaster wrote: Patient would like to go to Gastroenterology at [facility]

## 2024-01-24 NOTE — Telephone Encounter (Signed)
 Referral sent.   Last visit for GI concern was in January 2024 with hemorrhoid concerns. I am assuming this is her concern/wish for the referral.   No detailed reason for the referral request listed.   I have requested that we find a provider near [facility]. I am not sure that we have an office at the hospital.

## 2024-01-25 ENCOUNTER — Encounter (INDEPENDENT_AMBULATORY_CARE_PROVIDER_SITE_OTHER): Payer: Self-pay

## 2024-01-25 ENCOUNTER — Ambulatory Visit (INDEPENDENT_AMBULATORY_CARE_PROVIDER_SITE_OTHER): Payer: Self-pay

## 2024-01-25 ENCOUNTER — Encounter: Payer: Self-pay | Admitting: *Deleted

## 2024-01-25 VITALS — BP 111/75 | HR 97 | Ht 59.5 in | Wt 160.0 lb

## 2024-01-25 DIAGNOSIS — H7012 Chronic mastoiditis, left ear: Secondary | ICD-10-CM | POA: Diagnosis not present

## 2024-01-25 DIAGNOSIS — H7322 Unspecified myringitis, left ear: Secondary | ICD-10-CM

## 2024-01-25 DIAGNOSIS — H9202 Otalgia, left ear: Secondary | ICD-10-CM

## 2024-01-25 MED ORDER — CORTISPORIN-TC 3.3-3-10-0.5 MG/ML OT SUSP: 4.0000 [drp] | Freq: Two times a day (BID) | OTIC | 1 refills | Status: DC

## 2024-01-25 NOTE — Patient Instructions (Signed)
 Keep ear dry - use cotton ball coated in vaseline Use cortisporin ear drops four drops twice a day for 2 weeks I have ordered an imaging study for you to complete prior to your next visit. Please call [facility] Scheduling at [phone] to schedule your imaging if you have not received a call within 24 hours. If you are unable to complete your imaging study prior to your next scheduled visit please call our office to let us know.

## 2024-01-25 NOTE — Progress Notes (Signed)
 Dear Dr. Aleen Campi, Here is my assessment for our mutual patient, Ottilia Pippenger. Thank you for allowing me the opportunity to care for your patient. Please do not hesitate to contact me should you have any other questions. Sincerely, Dr. Jovita Kussmaul  Otolaryngology Clinic Note Referring provider: Dr. Aleen Campi HPI:  Nancy [name] is a 48 y.o. female kindly referred by Dr. Aleen Campi for evaluation of left ear pain and discomfort.  Initial visit (01/2024): Patient reports: "I've had issues with left ear all my life" - had history of multiple ear infections as a kid, had ear tubes. Infections persisted during 20s and 30s but then in 2024, she had multiple ear infections as noted below. Currently, ear does not hurt but when she lays on her side, she feels like there is fullness. Pain is better after most recent round of abx and steroids. Most recent time ear drops were also done (ofloxacin), and they helped but ears started to itch. She reports that she had both ear and postauricular pain and pressure in March 2025.  Hearing down some on left, and some tinnitus but not constant.  She does swim and use earplugs; uses qtips on outside.  She reports that she moved to [place] 2 years ago, and does have significant amount of allergies "allergic to everything" - did SCIT as an adult x2. No CRS sx. Flonase when bad, no neti pot use, uses PO antihistamine  She does report chronic neck pain, and TMJ pain -- the ear symptoms feel different.   Used to work in [employer] so prior noise exposure  Patient denies: ear pain, vertigo, drainage Patient additionally denies: deep pain in ear canal, sensitivity to pressure changes Patient also denies barotrauma, vestibular suppressant use, ototoxic medication use Prior ear surgery: BTT  H&N Surgery: no Personal or FHx of bleeding dz or anesthesia difficulty: no   GLP-1: no AP/AC: no  Tobacco: no. 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 Susann Givens 12/26/2023: Left AOM, Rx: Cipro [facility] Oyster 01/12/2024: continued left ear pain and sx; Dx: Ofloxacin, pred, Bactrim; ref to ENT CBC 08/11/2022 and BMP 08/11/2022: BUN/Cr 5/0.65; WBC 5.4 MRI Brain 11/18/2021 independently interpreted with respect to ears:  noted left mastoid effusion, no significant paranasal sinus disease; no retrocochlear lesions noted PMH/Meds/All/SocHx/FamHx/ROS:   Past Medical History:  Diagnosis Date   Abnormal EKG    DDD (degenerative disc disease), lumbar    LBBB (left bundle branch block)    Lumbago with sciatica    Paresthesia of lower lip    Rheumatoid arthritis (HCC)    Scleritis    Vitamin D deficiency      Past Surgical History:  Procedure Laterality Date   CESAREAN SECTION  1995   LAMINECTOMY     2019   LAPAROSCOPIC HYSTERECTOMY     2010   NISSEN FUNDOPLICATION  2005   SPINAL CORD STIMULATOR INSERTION     2021    Family History  Problem Relation  Age of Onset   Rheum arthritis Mother    Coronary artery disease Brother        found at autopsy after committing suicide   Suicidality Brother 49       2013   Rheum arthritis Maternal Grandmother      Social Connections: Not on file      Current Outpatient Medications:    Aspirin-Acetaminophen-Caffeine (EXCEDRIN PO), Take 2 tablets by mouth as needed., Disp: , Rfl:    Cholecalciferol (VITAMIN D) 125 MCG (5000 UT) CAPS, Take 1 capsule by mouth 3 (three) times a  week., Disp: , Rfl:    Continuous Glucose Sensor (FREESTYLE LIBRE 3 SENSOR) MISC, Place 1 sensor on the skin every 14 days. Use to check glucose continuously, Disp: 0.66 each, Rfl: 1   fluconazole (DIFLUCAN) 100 MG tablet, Take 1 tablet (100 mg total) by mouth daily., Disp: 7 tablet, Rfl: 0   fluconazole (DIFLUCAN) 150 MG tablet, May repeat in 3 days if symptoms not resolved, Disp: 2 tablet, Rfl: 2   guaiFENesin (MUCINEX) 600 MG 12 hr tablet, Take 1 tablet (600 mg total) by mouth 2 (two) times daily., Disp: 20 tablet, Rfl: 0   HYDROcodone-acetaminophen (NORCO/VICODIN) 5-325 MG tablet, Take 1 tablet by mouth 2 (two) times daily as needed for moderate pain (pain score 4-6)., Disp: 15 tablet, Rfl: 0   hydrOXYzine (VISTARIL) 25 MG capsule, Take 1-2 capsules (25-50 mg total) by mouth every 8 (eight) hours as needed for itching., Disp: 45 capsule, Rfl: 0   inFLIXimab-axxq (AVSOLA) 100 MG injection, Inject 100 mg into the vein every 6 (six) weeks., Disp: , Rfl:    leflunomide (ARAVA) 20 MG tablet, Take 20 mg by mouth daily., Disp: , Rfl:    levocetirizine (XYZAL) 5 MG tablet, TAKE 1 TABLET BY MOUTH EVERY DAY IN THE EVENING, Disp: 30 tablet, Rfl: 1   Multiple Vitamin (MULTIVITAMIN ADULT PO), Take by mouth., Disp: , Rfl:    neomycin-colistin-hydrocortisone-thonzonium (CORTISPORIN-TC) 3.01-07-09-0.5 MG/ML OTIC suspension, Place 4 drops into the left ear in the morning and at bedtime for 14 days., Disp: 10 mL, Rfl: 1   sulfamethoxazole-trimethoprim (BACTRIM DS) 800-160 MG tablet, Take 1 tablet by mouth 2 (two) times daily., Disp: 20 tablet, Rfl: 0   SUMAtriptan 6 MG/0.5ML SOAJ, Inject into the skin daily., Disp: , Rfl:    Physical Exam:   BP 111/75 (BP Location: Left Arm, Patient Position: Sitting, Cuff Size: Large)   Pulse 97   Ht 4' 11.5" (1.511 m)   Wt 160 lb (72.6 kg)   SpO2 96%   BMI 31.78 kg/m   Salient findings:  CN II-XII intact Given history and complaints, ear microscopy was indicated and  performed for evaluation with findings as below in physical exam section and in procedures; Given history and complaints, ear microscopy was indicated and performed for evaluation with findings as below in physical exam section and in procedures; Right: EAC clear and TM intact with well pneumatized middle ear spaces, mild myringosclerosis posteriorly; Left: EAC patent, there is a white lesion posterosuperior quadrant/myringosclerosis(?), with some retraction pars flaccida area and thickening anteriorly with some drainage (not pulsatile). Difficult to assess middle ear status as a result Weber 512: mid Rinne 512: AC > BC b/l  Anterior rhinoscopy: Septum modest dev; bilateral inferior turbinates with modest hypertrophy No lesions of oral cavity/oropharynx; clear b/l TMJ crepitus No obviously palpable neck masses/lymphadenopathy/thyromegaly No respiratory distress or stridor  Seprately Identifiable Procedures:  Procedure: Bilateral ear microscopy using microscope (CPT  29562) Pre-procedure diagnosis: left ear pain, left ear myringitis Post-procedure diagnosis: same Indication: see above; given patient's otologic complaints and history, for improved and comprehensive examination of external ear and tympanic membrane, bilateral otologic examination using microscope was performed  Procedure: Patient was placed semi-recumbent. Both ear canals were examined using the microscope with findings above. Small amount of mucoid fluid suctioned from deep in EAC with remainder of findings above Patient tolerated the procedure well.   Impression & Plans:  Claribel Sachs is a 48 y.o. female with:  1. Left ear pain   2. Chronic mastoiditis of left side   3. Myringitis of left ear    Do not see a perforation, but seems to have some wetness and myringitis certainly. Given her symptoms and prior MRI, concern for chronic mastoiditis. She does have reasons for referred pain but this is different according to  her.  Did not react well to ofloxacin so will try cortisporin drops. Need to establish safe, dry ear.  - CT Temporal bones - Keep ear dry - Cortisporin drops BID x2 weeks - f/u in 4 weeks with audio and CT  See below regarding exact medications prescribed this encounter including dosages and route: Meds ordered this encounter  Medications   neomycin-colistin-hydrocortisone-thonzonium (CORTISPORIN-TC) 3.01-07-09-0.5 MG/ML OTIC suspension    Sig: Place 4 drops into the left ear in the morning and at bedtime for 14 days.    Dispense:  10 mL    Refill:  1      Thank you for allowing me the opportunity to care for your patient. Please do not hesitate to contact me should you have any other questions.  Sincerely, Jovita Kussmaul, MD Otolaryngologist (ENT), [facility] Phone: [phone] Fax: [phone]  01/25/2024, 4:00 PM   MDM:  Level 4 - [phone] Complexity/Problems addressed: mod - chronic problems, worse in frequency Data complexity: high - independent review of notes, labs; independent interpretation of testing - Morbidity: mod  - Prescription Drug prescribed or managed: yes

## 2024-01-30 ENCOUNTER — Ambulatory Visit
Admission: RE | Admit: 2024-01-30 | Discharge: 2024-01-30 | Disposition: A | Discharge status: Home / Self Care | Source: Ambulatory Visit | Attending: Otolaryngology | Admitting: Otolaryngology

## 2024-01-30 DIAGNOSIS — H7012 Chronic mastoiditis, left ear: Secondary | ICD-10-CM

## 2024-01-30 DIAGNOSIS — H9202 Otalgia, left ear: Secondary | ICD-10-CM

## 2024-01-30 DIAGNOSIS — H7322 Unspecified myringitis, left ear: Secondary | ICD-10-CM

## 2024-01-30 NOTE — Progress Notes (Unsigned)
 Referring Provider: Tollie Eth, NP Primary Care Physician:  Tollie Eth, NP Primary Gastroenterologist:  Dr. Marletta Lor  Chief Complaint  Patient presents with   Hemorrhoids    Hemorrhoids     HPI:   Nancy [name] is a 48 y.o. female presenting today at the request of Early, Sung Amabile, NP for hemorrhoids and IBS.   Today:  Patient reports hemorrhoids for many years. Has 4 children, 3 vaginal deliveries. Hemorrhoids have been bad for the last 2 weeks. Using some sort of compounded hemorrhoid cream PCP prescribed, but it isn't helping.  Applying 4 times a day. Also using preparation H suppositories 3-4 times a day.  Previously, her hemorrhoids have always responded to hemorrhoid creams.  Having rectal pain, intermittent bleeding. Pain is constant. Feels like razor blades.    Hemorrhoids are external and internal. Sometimes hemorrhoids will shrink up.   Last colonoscopy about 4 years ago in [place]. [facility]. No polyps.    Bowels move every day, sometimes 3-4 times a day. Rarely bristol 4. Usually 1-2 or 5-7. Stools are mostly loose. Rarely formed or hard. Hasn't taken anything for bowels. This is her baseline. Sometimes abdominal pain related to bowel movements. Gets bloating. Has a lot of gas. Has been on several rounds of antibiotics due to ear infection.   Reports undergoing quite a bit of testing previously in relation to her bowels.  Reports celiac screen was negative. Has given up a lot of foods. Doesn't make a lot of difference. Doesn't eat a lot of dairy. Has 1/2 and 1/2 her coffee daily. Dairy will cause diarrhea. .  Reports history of fundoplication.  No GERD.   Past Medical History:  Diagnosis Date   Abnormal EKG    DDD (degenerative disc disease), lumbar    LBBB (left bundle branch block)    Lumbago with sciatica    Paresthesia of lower lip    Rheumatoid arthritis (HCC)    Scleritis    Vitamin D deficiency     Past Surgical  History:  Procedure Laterality Date   CESAREAN SECTION  1995   LAMINECTOMY     2019   LAPAROSCOPIC HYSTERECTOMY     2010   NISSEN FUNDOPLICATION  2005   SPINAL CORD STIMULATOR INSERTION     2021    Current Outpatient Medications  Medication Sig Dispense Refill   Aspirin-Acetaminophen-Caffeine (EXCEDRIN PO) Take 2 tablets by mouth as needed.     Cholecalciferol (VITAMIN D) 125 MCG (5000 UT) CAPS Take 1 capsule by mouth 3 (three) times a week.     Continuous Glucose Sensor (FREESTYLE LIBRE 3 SENSOR) MISC Place 1 sensor on the skin every 14 days. Use to check glucose continuously 0.66 each 1   dicyclomine (BENTYL) 10 MG capsule Take 1 capsule (10 mg total) by mouth up to 3 times daily before meals and at bedtime for diarrhea and abdominal cramping. 90 capsule 1   hydrOXYzine (VISTARIL) 25 MG capsule Take 1-2 capsules (25-50 mg total) by mouth every 8 (eight) hours as needed for itching. 45 capsule 0   leflunomide (ARAVA) 20 MG tablet Take 20 mg by mouth daily.     levocetirizine (XYZAL) 5 MG tablet TAKE 1 TABLET BY MOUTH EVERY DAY IN THE EVENING 30 tablet 1   Multiple Vitamin (MULTIVITAMIN ADULT PO) Take by mouth.     tiZANidine (ZANAFLEX) 4 MG tablet 1 tablet as needed Orally Three times a day     HYDROcodone-acetaminophen (NORCO/VICODIN)  5-325 MG tablet Take 1 tablet by mouth 2 (two) times daily as needed for moderate pain (pain score 4-6). (Patient not taking: Reported on 02/01/2024) 15 tablet 0   inFLIXimab-axxq (AVSOLA) 100 MG injection Inject 100 mg into the vein every 6 (six) weeks. (Patient not taking: Reported on 02/01/2024)     SUMAtriptan 6 MG/0.5ML SOAJ Inject into the skin daily. (Patient not taking: Reported on 02/01/2024)     No current facility-administered medications for this visit.    Allergies as of 02/01/2024 - Review Complete 02/01/2024  Allergen Reaction Noted   Cephalosporins Hives, Diarrhea, and Swelling 10/25/2023   Erythromycin Nausea And Vomiting 05/18/2022    Famotidine Nausea And Vomiting 10/25/2023   Nabumetone  05/18/2022   Penicillins Hives 05/18/2022   Wound dressing adhesive Other (See Comments) 08/03/2022    Family History  Problem Relation Age of Onset   Rheum arthritis Mother    Coronary artery disease Brother        found at autopsy after committing suicide   Suicidality Brother 49       2013   Rheum arthritis Maternal Grandmother     Social History   Socioeconomic History   Marital status: Divorced    Spouse name: Not on file   Number of children: Not on file   Years of education: Not on file   Highest education level: Not on file  Occupational History   Not on file  Tobacco Use   Smoking status: Former    Types: Cigarettes    Passive exposure: Past   Smokeless tobacco: Never  Vaping Use   Vaping status: Never Used  Substance and Sexual Activity   Alcohol use: Never   Drug use: Never   Sexual activity: Not Currently    Birth control/protection: Surgical  Other Topics Concern   Not on file  Social History Narrative   Not on file   Social Drivers of Health   Financial Resource Strain: Not on file  Food Insecurity: Not on file  Transportation Needs: Not on file  Physical Activity: Not on file  Stress: Not on file  Social Connections: Not on file  Intimate Partner Violence: Not on file    Review of Systems: Gen: Denies any fever, chills, cold or flulike symptoms, presyncope, syncope. CV: Denies chest pain, heart palpitations. Resp: Denies shortness of breath, cough. GI: See HPI GU : Denies urinary burning, urinary frequency, urinary hesitancy MS: Denies joint pain. Derm: Denies rash. Psych: Denies depression, anxiety.  Heme: See HPI  Physical Exam: BP 129/74 (BP Location: Right Arm, Patient Position: Sitting, Cuff Size: Normal)   Pulse 76   Temp 97.7 F (36.5 C) (Temporal)   Ht 4\' 11"  (1.499 m)   Wt 163 lb 9.6 oz (74.2 kg)   BMI 33.04 kg/m  General:   Alert and oriented. Pleasant and  cooperative. Well-nourished and well-developed.  Head:  Normocephalic and atraumatic. Eyes:  Without icterus, sclera clear and conjunctiva pink.  Ears:  Normal auditory acuity. Lungs:  Clear to auscultation bilaterally. No wheezes, rales, or rhonchi. No distress.  Heart:  S1, S2 present without murmurs appreciated.  Abdomen:  +BS, soft, non-tender and non-distended. No HSM noted. No guarding or rebound. No masses appreciated.  Rectal:  Prolapsing internal hemorrhoids, fairly significant tenderness on exam with inability to do internal exam. Increased tenderness when trying to examine at the 6 oclock position, but unable to completely assess for an anal fissure.  Msk:  Symmetrical without gross deformities. Normal  posture. Extremities:  Without edema. Neurologic:  Alert and  oriented x4;  grossly normal neurologically. Skin:  Intact without significant lesions or rashes. Psych:  Normal mood and affect.    Assessment:  48 year old female with history of rheumatoid arthritis, LBBB, migraines, IBS, hemorrhoids, presenting today for further evaluation of rectal pain/hemorrhoids and IBS at the request of Enid Skeens, NP.   Rectal pain/hemorrhoids/?anal fissure: Currently with fairly significant rectal pain described as razor blades, worse with a bowel movement.  Associated rectal bleeding.  Patient reports chronic history of hemorrhoids with symptoms typically improving with hemorrhoid creams, but no improvement after Preparation H and now using a compounded cream from her primary care doctor for hemorrhoids.  On exam today, she has prolapsing internal hemorrhoids, quite a bit of tenderness when trying to examine posteriorly, and unable to complete internal exam due to significant discomfort.  I suspect that she likely has a posterior anal fissure contributing to her persistent pain that is not responding to hemorrhoid creams.  I will plan to treat her with compounded [facility] hemorrhoid  cream with nitroglycerin.  Rectal bleeding:  Likely secondary to hemorrhoids and possible anal fissure as per above. Reports colonoscopy about 4 years ago in [place] without polyps.  IBS-D: Chronic.  Has never tried any medications for this.  Considered Xifaxan, but as she has been on numerous oral antibiotics recently due to an ear infection, will hold off on this for now.  Recommended starting a daily probiotic and dicyclomine for now. Also recommended strict lactose free diet.  Notably, she reports celiac screen previously negative in [place].  Could consider CSID testing in the future.   Plan:  [facility] hemorrhoid cream compounded with 0.125% nitroglycerin 4 times daily. Sitz bath's twice daily Limit toilet time to 2-3 minutes. Avoid straining. Lactose-free diet or Lactaid tablets prior to dairy. Daily probiotic Dicyclomine 10 mg up to 3 times daily before meals and at bedtime.  Hold in the setting of constipation. Will need to request colonoscopy records. Can have her sign a release at her follow-up visit.  Follow-up in 4 weeks.    Ermalinda Memos, PA-C [facility] 02/01/2024

## 2024-02-01 ENCOUNTER — Encounter: Payer: Self-pay | Admitting: Gastroenterology

## 2024-02-01 ENCOUNTER — Ambulatory Visit: Admitting: Gastroenterology

## 2024-02-01 VITALS — BP 129/74 | HR 76 | Temp 97.7°F | Ht 59.0 in | Wt 163.6 lb

## 2024-02-01 DIAGNOSIS — K625 Hemorrhage of anus and rectum: Secondary | ICD-10-CM

## 2024-02-01 DIAGNOSIS — K649 Unspecified hemorrhoids: Secondary | ICD-10-CM

## 2024-02-01 DIAGNOSIS — K580 Irritable bowel syndrome with diarrhea: Secondary | ICD-10-CM | POA: Diagnosis not present

## 2024-02-01 DIAGNOSIS — K602 Anal fissure, unspecified: Secondary | ICD-10-CM

## 2024-02-01 DIAGNOSIS — K648 Other hemorrhoids: Secondary | ICD-10-CM

## 2024-02-01 DIAGNOSIS — K6289 Other specified diseases of anus and rectum: Secondary | ICD-10-CM | POA: Diagnosis not present

## 2024-02-01 MED ORDER — DICYCLOMINE HCL 10 MG PO CAPS
ORAL_CAPSULE | ORAL | 1 refills | Status: DC
Start: 2024-02-01 — End: 2024-02-27

## 2024-02-01 NOTE — Patient Instructions (Signed)
 I am calling in a compounded hemorrhoid cream with nitroglycerine to treat a possible anal fissure.  You will apply this medication 4 times a day until I see you back in the office in about 4 weeks and we will make a decision at that time if the medication can be discontinued.  I also recommend doing sitz bath's a couple times a day to help with hemorrhoid symptoms.  Limit toilet time to 2-3 minutes.  Avoid straining.   To help with bowel frequency, I am prescribing dicyclomine 10 mg.  You can take this medication up to 3 times a day before meals and at bedtime or every 6 hours as needed.  I recommend that you start with taking once a day and increasing as needed.  If you become constipated, hold the medication until your bowels start to move again.  Also recommend that you follow a strict lactose-free diet or take Lactaid tablets prior to any dairy consumption.  I would also like for you to add a daily probiotic such as Vear Clock' colon health, align, digestive advantage, Restora as you have been on many antibiotics recently.    It was good to meet you today!   Ermalinda Memos, PA-C [facility]

## 2024-02-02 ENCOUNTER — Encounter: Payer: Self-pay | Admitting: Gastroenterology

## 2024-02-05 ENCOUNTER — Other Ambulatory Visit (HOSPITAL_COMMUNITY)

## 2024-02-23 ENCOUNTER — Other Ambulatory Visit: Payer: Self-pay | Admitting: Gastroenterology

## 2024-02-23 DIAGNOSIS — K580 Irritable bowel syndrome with diarrhea: Secondary | ICD-10-CM

## 2024-02-27 ENCOUNTER — Ambulatory Visit (INDEPENDENT_AMBULATORY_CARE_PROVIDER_SITE_OTHER): Admitting: Audiology

## 2024-02-27 ENCOUNTER — Other Ambulatory Visit: Payer: Self-pay | Admitting: Nurse Practitioner

## 2024-02-27 ENCOUNTER — Encounter (INDEPENDENT_AMBULATORY_CARE_PROVIDER_SITE_OTHER): Payer: Self-pay

## 2024-02-27 ENCOUNTER — Ambulatory Visit (INDEPENDENT_AMBULATORY_CARE_PROVIDER_SITE_OTHER): Admitting: Otolaryngology

## 2024-02-27 VITALS — BP 118/81 | HR 83 | Ht 59.0 in | Wt 160.0 lb

## 2024-02-27 DIAGNOSIS — J3489 Other specified disorders of nose and nasal sinuses: Secondary | ICD-10-CM | POA: Diagnosis not present

## 2024-02-27 DIAGNOSIS — H9202 Otalgia, left ear: Secondary | ICD-10-CM

## 2024-02-27 DIAGNOSIS — T7840XA Allergy, unspecified, initial encounter: Secondary | ICD-10-CM

## 2024-02-27 DIAGNOSIS — T170XXA Foreign body in nasal sinus, initial encounter: Secondary | ICD-10-CM

## 2024-02-27 DIAGNOSIS — J320 Chronic maxillary sinusitis: Secondary | ICD-10-CM

## 2024-02-27 DIAGNOSIS — J343 Hypertrophy of nasal turbinates: Secondary | ICD-10-CM

## 2024-02-27 DIAGNOSIS — J342 Deviated nasal septum: Secondary | ICD-10-CM

## 2024-02-27 DIAGNOSIS — Z0110 Encounter for examination of ears and hearing without abnormal findings: Secondary | ICD-10-CM

## 2024-02-27 DIAGNOSIS — R0981 Nasal congestion: Secondary | ICD-10-CM

## 2024-02-27 DIAGNOSIS — H93292 Other abnormal auditory perceptions, left ear: Secondary | ICD-10-CM

## 2024-02-27 NOTE — Progress Notes (Signed)
  [address], [address] [place], [place] [address] [phone]  Audiological Evaluation    Name: Nancy [name]     DOB:   05/23/1976      MRN:   [id number]                                                                                     Service Date: 02/27/2024     Accompanied by: unaccompanied    Patient comes today after Dr. Lydia Sams, ENT sent a referral for a hearing evaluation due to concerns with ear pain.   Symptoms Yes Details  Hearing loss  []    Tinnitus  []    Ear pain/ infections/pressure  [x]  Left constant ache sensation  Balance problems  []    Noise exposure history  []    Previous ear surgeries  [x]  Several in both ears (mentioned tubes as a child ), reports more in the left   Family history of hearing loss  []    Amplification  []    Other  []      Otoscopy: Right ear: Clear external ear canals and notable landmarks visualized on the tympanic membrane. Left ear:  Abnormal eardrum appearance.  Tympanometry: Right ear: Type A- Normal external ear canal volume with normal middle ear pressure and tympanic membrane compliance Left ear: Type A- Normal external ear canal volume with normal middle ear pressure and tympanic membrane compliance    Pure tone Audiometry: Both ears- Normal hearing from [phone] Hz.   Speech Audiometry: Right ear- Speech Reception Threshold (SRT) was obtained at 10 dBHL. Left ear-Speech Reception Threshold (SRT) was obtained at 15 dBHL.   Word Recognition Score Tested using NU-6 (MLV) Right ear: 100% was obtained at a presentation level of 50 dBHL with contralateral masking which is deemed as  excellent. Left ear: 100% was obtained at a presentation level of 50 dBHL with contralateral masking which is deemed as  excellent.   The hearing test results were completed under headphones and results are deemed to be of good reliability. Test technique:  conventional    Recommendations: Follow up with ENT as scheduled for  today. Return for a hearing evaluation if concerns with hearing changes arise or per MD recommendation.   [name] MARIE LEROUX-MARTINEZ, AUD

## 2024-02-27 NOTE — Progress Notes (Signed)
 Dear Dr. Shirley Douglas, Here is my assessment for our mutual patient, Nancy [name]. Thank you for allowing me the opportunity to care for your patient. Please do not hesitate to contact me should you have any other questions. Sincerely, Dr. Milon Aloe  Otolaryngology Clinic Note Referring provider: Dr. Shirley Douglas HPI:  Nancy [name] is a 48 y.o. female kindly referred by Dr. Shirley Douglas for evaluation of left ear pain and discomfort.  Initial visit (01/2024): Patient reports: "I've had issues with left ear all my life" - had history of multiple ear infections as a kid, had ear tubes. Infections persisted during 20s and 30s but then in 2024, she had multiple ear infections as noted below. Currently, ear does not hurt but when she lays on her side, she feels like there is fullness on left. Pain is better after most recent round of abx and steroids. Most recent time ear drops were also done (ofloxacin ), and they helped but ears started to itch. She reports that she had both ear and postauricular pain and pressure in March 2025.  Hearing down some on left, and some tinnitus but not constant.  She does swim and use earplugs; uses qtips on outside.  She reports that she moved to [place] 2 years ago, and does have significant amount of allergies "allergic to everything" - did SCIT as an adult x2. Flonase when bad, no neti pot use, uses PO antihistamine  She does report chronic neck pain, and TMJ pain -- the ear symptoms feel different.   Used to work in [employer] so prior noise exposure  Patient denies: ear pain, vertigo, drainage Patient additionally denies: deep pain in ear canal, sensitivity to pressure changes Patient also denies barotrauma, vestibular suppressant use, ototoxic medication use Prior ear surgery: BTT  --------------------------------------------------------- 02/27/2024 She reports that itching in the ear is better. Still some fullness  on left when she lays down, but no pain in ear, no  postauricular pressure, no vertigo, no drainage. She has been using the drops. She did have an audio and CT Temporal bone as well. Of note, she does report constant left sided fullness and nasal obstruction. She reports that she had a dental extraction and the dentist had a hard time extracting the left maxillary tooth which broke and was in fragments. Since then she has had left sided max pressure and fullness. She denies frequent sinus infections but does have intermittent drainage. She does use flonase and PO antihistamine.   H&N Surgery: denies Personal or FHx of bleeding dz or anesthesia difficulty: no   GLP-1: no AP/AC: no  Tobacco: no. Lives in [place], [place]  PMHx: Migraines, Chronic neck pain with spinal cord stimulator, Rheumatoid arthritis, LBBB(?),  Independent Review of Additional Tests or Records:  Archibald Beard, NP (FM): 01/31/2023 and 02/08/2023 and 10/04/2023: severe ear pain, radiates down her neck; h/o TM perforation; noted URI sx; March 2024 Dx AOM - cefdinir , pred; repeat Rx with doxy and xyzal  in April 2024; Nov 2024 - similar sx after URI, nasal sx and cough; left ear pain and drainage, with Rx with doxy Nelda Balsam FM (10/13/2023): ongoing ear pain and drainage; no other sx, Rx: cefdinir , cortisporin  Dr. Robina Chol 12/26/2023: Left AOM, Rx: Cipro  Nelda Balsam 01/12/2024: continued left ear pain and sx; Dx: Ofloxacin , pred, Bactrim ; ref to ENT CBC 08/11/2022 and BMP 08/11/2022: BUN/Cr 5/0.65; WBC 5.4 MRI Brain 11/18/2021 independently interpreted with respect to ears:  no significant mastoid effusion, no significant paranasal sinus disease; no retrocochlear lesions noted CT  Temporal bones 01/30/2024 independently interpreted and agree with read: b/l mastoids, ME well aerated; left inferior TM thickening; no evidence of cholesteatoma; otic capsule and ossicular chain without noted pathology; noted left septal deviation, left foreign body (tooth fragment) left max with opacification of  left max (subtotal); small opacification (mucocele v/s fluid) left frotntoethmoid recess, modest anterior ethmoid opacification on left; right paranasal sinuses clear 02/2024 Audiogram was independently reviewed and interpreted by me and it reveals A/A tymps; normal hearing thresholds; WRT 100% AU at 50dB  SNHL= Sensorineural hearing loss   PMH/Meds/All/SocHx/FamHx/ROS:   Past Medical History:  Diagnosis Date   Abnormal EKG    DDD (degenerative disc disease), lumbar    LBBB (left bundle branch block)    Lumbago with sciatica    Paresthesia of lower lip    Rheumatoid arthritis (HCC)    Scleritis    Vitamin D deficiency      Past Surgical History:  Procedure Laterality Date   CESAREAN SECTION  1995   LAMINECTOMY     2019   LAPAROSCOPIC HYSTERECTOMY     2010   NISSEN FUNDOPLICATION  2005   SPINAL CORD STIMULATOR INSERTION     2021    Family History  Problem Relation Age of Onset   Rheum arthritis Mother    Coronary artery disease Brother        found at autopsy after committing suicide   Suicidality Brother 49       2013   Rheum arthritis Maternal Grandmother      Social Connections: Not on file      Current Outpatient Medications:    Aspirin-Acetaminophen -Caffeine (EXCEDRIN PO), Take 2 tablets by mouth as needed., Disp: , Rfl:    Cholecalciferol (VITAMIN D) 125 MCG (5000 UT) CAPS, Take 1 capsule by mouth 3 (three) times a week., Disp: , Rfl:    Continuous Glucose Sensor (FREESTYLE LIBRE 3 SENSOR) MISC, Place 1 sensor on the skin every 14 days. Use to check glucose continuously (Patient not taking: Reported on 02/29/2024), Disp: 0.66 each, Rfl: 1   levocetirizine (XYZAL ) 5 MG tablet, TAKE 1 TABLET BY MOUTH EVERY DAY IN THE EVENING, Disp: 30 tablet, Rfl: 5   Multiple Vitamin (MULTIVITAMIN ADULT PO), Take by mouth., Disp: , Rfl:    SUMAtriptan 6 MG/0.5ML SOAJ, Inject into the skin daily., Disp: , Rfl:    tiZANidine (ZANAFLEX) 4 MG tablet, 1 tablet as needed Orally Three  times a day, Disp: , Rfl:    Upadacitinib ER (RINVOQ) 15 MG TB24, 1 tablet Orally Once a day for 30 days (Patient not taking: Reported on 02/29/2024), Disp: , Rfl:    dicyclomine  (BENTYL ) 10 MG capsule, TAKE 1 CAPSULE (10 MG TOTAL) BY MOUTH UP TO 3 TIMES DAILY BEFORE MEALS AND AT BEDTIME FOR DIARRHEA AND ABDOMINAL CRAMPING., Disp: 270 capsule, Rfl: 0   Physical Exam:   BP 118/81 (BP Location: Right Arm, Patient Position: Sitting, Cuff Size: Normal)   Pulse 83   Ht 4\' 11"  (1.499 m)   Wt 160 lb (72.6 kg)   SpO2 99%   BMI 32.32 kg/m   Salient findings:  CN II-XII intact Given history and complaints, ear microscopy was indicated and performed for evaluation with findings as below in physical exam section and in procedures  Right: EAC clear and TM intact with well pneumatized middle ear space, mild myringosclerosis posteriorly; Left: EAC patent, TM intact, some retraction pars flaccida area and thickening anteriorly which is likely myringosclerosis; posterior TM appears aerated;  minimal thickening/myringosclerosis inferiorly but no myringitis or drainage noted today Weber 512: mid Rinne 512: AC > BC b/l  Anterior rhinoscopy: Septum modest dev; left bilateral inferior turbinates with modest hypertrophy No lesions of oral cavity/oropharynx; clear b/l TMJ crepitus No obviously palpable neck masses/lymphadenopathy/thyromegaly No respiratory distress or stridor  Seprately Identifiable Procedures:  Procedure: Bilateral ear microscopy using microscope (CPT 92504) Pre-procedure diagnosis: left ear pain, left ear myringitis Post-procedure diagnosis: same Indication: see above; given patient's otologic complaints and history, for improved and comprehensive examination of external ear and tympanic membrane, bilateral otologic examination using microscope was performed  Procedure: Patient was placed semi-recumbent. Both ear canals were examined using the microscope with findings above. Small amount of  mucoid fluid suctioned from deep in EAC with remainder of findings above Patient tolerated the procedure well.   Impression & Plans:  Yasaira Rosengrant is a 48 y.o. female with:  1. Left maxillary sinusitis   2. Nasal septal deviation   3. Hypertrophy of both inferior nasal turbinates   4. Foreign body in maxillary sinus   5. Nasal obstruction   6. Nasal congestion   7. Left ear pain    Audio and workup from ear standpoint reassuring; suspect left discomfort may be Eczematoid OE v/s referred pain given TMJ crepitus; her ear does feel better today but still some pressure.  Of note, she also has left chronic maxillary sinusitis most likely related to retained tooth fragment and left nasal obstruction and max pressure and congestion. We discussed options including observation, abx, and FESS. Given foreign body, and symptoms, FESS appears to be most appropriate. For nasal obstruction and congestion, we discussed the goals of septoplasty and turbinate reduction, and expectations for postoperative management. Will plan to leave splints in place, and removal was also discussed. We also discussed nasal obstruction post-operatively until splints in place and pain management.  We discussed R/B/A including pain, infection, bleeding (~5% risk of operative visit for control), persistent symptoms, need for revision surgery, and other risks including damage to surrounding structures, septal perforation, and injury to skull base with risk of CSF leak and additional intracranial complications, anesthetic complications, among others.  We discussed use of nasal saline spray and nasal saline irrigations post-operatively We also discussed use of intranasal steroid post-operatively until healing occurs  We also discussed the goals of sinus surgery, and expectations for postoperative management. We discussed R/B/A including pain, infection, bleeding (~5% risk of operative visit for control), persistent symptoms,  need for revision surgery, and other risks including damage to the eye and loss of vision, and injury to skull base with risk of CSF leak and additional intracranial complications, anesthetic complications, among others.   She also understands that in order to remove the tooth, we may need a caldwell luc approach as the tooth is somewhat anterior and inferior. She understands this and the risks including dehiscence of wound, infection, and drainage, as well as numbness over V2 and cosmetic deformity.  Patient understands and is ready to proceed.  - Will schedule for septo/turbs, left maxillary antrostomy with tissue removal, possible left caldwell luc approach or maxillary mega-antrostomy, image guidance - Daily sinus rinses; continue flonase BID - f/u POD 5  See below regarding exact medications prescribed this encounter including dosages and route: No orders of the defined types were placed in this encounter.     Thank you for allowing me the opportunity to care for your patient. Please do not hesitate to contact me should you have any other  questions.  Sincerely, Milon Aloe, MD Otolaryngologist (ENT), [facility] Phone: [phone] Fax: [phone]  03/10/2024, 5:56 PM   MDM:  Level 4 - 99214 Complexity/Problems addressed: mod - chronic problems Data complexity: mod - independent CT interpretation - Morbidity: mod - decision for surgery - Prescription Drug prescribed or managed: no

## 2024-02-27 NOTE — Progress Notes (Unsigned)
 Referring Provider: Annella Kief, NP Primary Care Physician:  Annella Kief, NP Primary GI Physician: Dr. Mordechai April  No chief complaint on file.   HPI:   Nancy [name] is a 48 y.o. female with history of rheumatoid arthritis, LBBB, migraines, IBS, hemorrhoids, presenting today for follow-up of rectal pain/hemorrhoids, rectal bleeding, and IBS.  Last seen in the office at the time of initial consult 02/01/2024.  She reported fairly significant rectal pain described as razor blades, worse with bowel movements with associated rectal bleeding.  No improvement with Preparation H or compounded hemorrhoid cream.  On rectal exam, she has prolapsing internal hemorrhoids, quite a bit of tenderness when trying to examine posteriorly, and unable to complete internal exam due to discomfort.  Suspected posterior anal fissure.  Plan to treat with compounded [facility] hemorrhoid cream with nitroglycerin .  Regarding IBS-D, she had never been on any medications to help with this.  Considered Xifaxan, but as she had been on numerous oral antibiotics due to ear infection, recommended daily probiotic and dicyclomine  for now.  Also recommended lactose-free diet. Could consider testing for sucrase deficiency depending on clinical course. Patient reported celiac screen previously negative in [place] .   Today:     Needs to sign release for colonoscopy records ***  Reviewed labs available lab work.  Hemoglobin 15.8 on/10/25  Past Medical History:  Diagnosis Date   Abnormal EKG    DDD (degenerative disc disease), lumbar    LBBB (left bundle branch block)    Lumbago with sciatica    Paresthesia of lower lip    Rheumatoid arthritis (HCC)    Scleritis    Vitamin D deficiency     Past Surgical History:  Procedure Laterality Date   CESAREAN SECTION  1995   LAMINECTOMY     2019   LAPAROSCOPIC HYSTERECTOMY     2010   NISSEN FUNDOPLICATION  2005   SPINAL CORD STIMULATOR INSERTION      2021    Current Outpatient Medications  Medication Sig Dispense Refill   Aspirin-Acetaminophen -Caffeine (EXCEDRIN PO) Take 2 tablets by mouth as needed.     Cholecalciferol (VITAMIN D) 125 MCG (5000 UT) CAPS Take 1 capsule by mouth 3 (three) times a week.     Continuous Glucose Sensor (FREESTYLE LIBRE 3 SENSOR) MISC Place 1 sensor on the skin every 14 days. Use to check glucose continuously 0.66 each 1   dicyclomine  (BENTYL ) 10 MG capsule TAKE 1 CAPSULE (10 MG TOTAL) BY MOUTH UP TO 3 TIMES DAILY BEFORE MEALS AND AT BEDTIME FOR DIARRHEA AND ABDOMINAL CRAMPING. 270 capsule 0   HYDROcodone -acetaminophen  (NORCO/VICODIN) 5-325 MG tablet Take 1 tablet by mouth 2 (two) times daily as needed for moderate pain (pain score 4-6). 15 tablet 0   hydrOXYzine  (VISTARIL ) 25 MG capsule Take 1-2 capsules (25-50 mg total) by mouth every 8 (eight) hours as needed for itching. 45 capsule 0   inFLIXimab-axxq (AVSOLA) 100 MG injection Inject 100 mg into the vein every 6 (six) weeks. (Patient not taking: Reported on 02/01/2024)     leflunomide (ARAVA) 20 MG tablet Take 20 mg by mouth daily. (Patient not taking: Reported on 02/27/2024)     levocetirizine (XYZAL ) 5 MG tablet TAKE 1 TABLET BY MOUTH EVERY DAY IN THE EVENING 30 tablet 5   Multiple Vitamin (MULTIVITAMIN ADULT PO) Take by mouth.     SUMAtriptan 6 MG/0.5ML SOAJ Inject into the skin daily.     tiZANidine (ZANAFLEX) 4 MG tablet 1 tablet  as needed Orally Three times a day     Upadacitinib ER (RINVOQ) 15 MG TB24 1 tablet Orally Once a day for 30 days     No current facility-administered medications for this visit.    Allergies as of 02/29/2024 - Review Complete 02/27/2024  Allergen Reaction Noted   Cephalosporins Hives, Diarrhea, and Swelling 10/25/2023   Erythromycin Nausea And Vomiting 05/18/2022   Famotidine Nausea And Vomiting 10/25/2023   Nabumetone  05/18/2022   Penicillins Hives 05/18/2022   Wound dressing adhesive Other (See Comments) 08/03/2022     Family History  Problem Relation Age of Onset   Rheum arthritis Mother    Coronary artery disease Brother        found at autopsy after committing suicide   Suicidality Brother 49       2013   Rheum arthritis Maternal Grandmother     Social History   Socioeconomic History   Marital status: Divorced    Spouse name: Not on file   Number of children: Not on file   Years of education: Not on file   Highest education level: Not on file  Occupational History   Not on file  Tobacco Use   Smoking status: Former    Types: Cigarettes    Passive exposure: Past   Smokeless tobacco: Never  Vaping Use   Vaping status: Never Used  Substance and Sexual Activity   Alcohol use: Never   Drug use: Never   Sexual activity: Not Currently    Birth control/protection: Surgical  Other Topics Concern   Not on file  Social History Narrative   Not on file   Social Drivers of Health   Financial Resource Strain: Not on file  Food Insecurity: Not on file  Transportation Needs: Not on file  Physical Activity: Not on file  Stress: Not on file  Social Connections: Not on file    Review of Systems: Gen: Denies fever, chills, anorexia. Denies fatigue, weakness, weight loss.  CV: Denies chest pain, palpitations, syncope, peripheral edema, and claudication. Resp: Denies dyspnea at rest, cough, wheezing, coughing up blood, and pleurisy. GI: Denies vomiting blood, jaundice, and fecal incontinence.   Denies dysphagia or odynophagia. Derm: Denies rash, itching, dry skin Psych: Denies depression, anxiety, memory loss, confusion. No homicidal or suicidal ideation.  Heme: Denies bruising, bleeding, and enlarged lymph nodes.  Physical Exam: There were no vitals taken for this visit. General:   Alert and oriented. No distress noted. Pleasant and cooperative.  Head:  Normocephalic and atraumatic. Eyes:  Conjuctiva clear without scleral icterus. Heart:  S1, S2 present without murmurs  appreciated. Lungs:  Clear to auscultation bilaterally. No wheezes, rales, or rhonchi. No distress.  Abdomen:  +BS, soft, non-tender and non-distended. No rebound or guarding. No HSM or masses noted. Msk:  Symmetrical without gross deformities. Normal posture. Extremities:  Without edema. Neurologic:  Alert and  oriented x4 Psych:  Normal mood and affect.    Assessment:     Plan:  ***   Shana Daring, PA-C [facility] 02/29/2024

## 2024-02-29 ENCOUNTER — Encounter: Payer: Self-pay | Admitting: Gastroenterology

## 2024-02-29 ENCOUNTER — Ambulatory Visit: Admitting: Gastroenterology

## 2024-02-29 VITALS — BP 114/58 | HR 79 | Temp 97.9°F | Ht 59.0 in | Wt 163.0 lb

## 2024-02-29 DIAGNOSIS — K580 Irritable bowel syndrome with diarrhea: Secondary | ICD-10-CM

## 2024-02-29 DIAGNOSIS — K648 Other hemorrhoids: Secondary | ICD-10-CM

## 2024-02-29 DIAGNOSIS — K649 Unspecified hemorrhoids: Secondary | ICD-10-CM

## 2024-02-29 DIAGNOSIS — K602 Anal fissure, unspecified: Secondary | ICD-10-CM

## 2024-02-29 DIAGNOSIS — K6289 Other specified diseases of anus and rectum: Secondary | ICD-10-CM | POA: Diagnosis not present

## 2024-02-29 NOTE — Patient Instructions (Addendum)
 Continue using [employer] hemorrhoid cream compounded with nitroglycerin  3-4 times a day until your rectal pain has resolved.  Once rectal pain has resolved, I would like you to continue the medication an additional 2 weeks.  You may continue to use dicyclomine  3 times a day before meals for now.  I am going to try to get you a free 4-day trial of Sucraid to try for possible sucrase deficiency contributing to your chronic issues with diarrhea and bloating.  Hold dicyclomine  when you try Sucraid.    I will see you back in 6 weeks or sooner if needed.   Shana Daring, PA-C [facility]

## 2024-03-01 ENCOUNTER — Encounter: Payer: Self-pay | Admitting: Audiology

## 2024-03-06 ENCOUNTER — Telehealth: Payer: Self-pay | Admitting: *Deleted

## 2024-03-06 ENCOUNTER — Encounter: Payer: Self-pay | Admitting: *Deleted

## 2024-03-06 NOTE — Telephone Encounter (Signed)
 Received letter for approval for Sucraid. It will be mailed to pt on May 1. Sent pt a [employer] geologist.

## 2024-03-11 ENCOUNTER — Telehealth: Payer: Self-pay | Admitting: *Deleted

## 2024-03-11 ENCOUNTER — Encounter: Payer: Self-pay | Admitting: *Deleted

## 2024-03-11 NOTE — Telephone Encounter (Signed)
 Pt called and states she is having lots of rectal pain. She states she thinks it the fissure. She has been using both creams and still having problems.

## 2024-03-11 NOTE — Telephone Encounter (Signed)
Recommend OV for reevaluation. 

## 2024-03-12 NOTE — Telephone Encounter (Signed)
 Noted.

## 2024-03-12 NOTE — Telephone Encounter (Signed)
 Can you please call pt and make an OV

## 2024-03-31 NOTE — Telephone Encounter (Signed)
 Received notification that patient responded well to Sucraid and requested to continue with Rx. Rx was already sent in. See scan under media tab for documentation.

## 2024-04-10 NOTE — H&P (View-Only) (Signed)
 Referring Provider: Oris Camie BRAVO, NP Primary Care Physician:  Oris Camie BRAVO, NP Primary GI Physician: Dr. Cindie  Chief Complaint  Patient presents with   Follow-up    Follow up. Still having pains in rectum     HPI:   Nancy [name] is a 48 y.o. female with history of rheumatoid arthritis, LBBB, migraines, IBS, hemorrhoids, presenting today for follow-up of rectal pain/hemorrhoids/anal fissure, rectal bleeding, and IBS.   Initial consult 02/01/2024 for rectal pain, rectal bleeding.  On rectal exam, she had prolapsing internal hemorrhoids, quite a bit of tenderness when trying to examine posteriorly, and unable to complete internal exam due to discomfort.  She was prescribed [facility] hemorrhoid cream with nitroglycerin  for suspected anal fissure.  She was also prescribed dicyclomine  for IBS-D and recommended lactose-free diet.  At her last office visit 02/25/2024, rectal bleeding resolved.  Pain much improved.  Still using rectal cream 3-4 times a day.  Bloating and diarrhea also improved, having 2-3 bowel movements per day, but still having a lot of gas.  She was taking dicyclomine  2-3 times a day.  She had switched to lactose-free and was avoiding dairy for the most part.  Recommend continuing rectal cream, continue dicyclomine  and lactose-free diet, submit for free 4-day trial of Sucraid, and follow-up in 6 weeks.   Patient received Sucraid and I received notification that she responded well and requested to continue with prescription.   Today: Sharp knife like rectal pain has resolved. Some occasional symptoms, but not routinely.  Also with intermittent rectal itching.  Reports hemorrhoids are no longer protruding.   States, Sucraide is Artist.  Having 1-2 Bristol 3-4 Bms daily.  Bloating improved.  Not taking dicyclomine .  Sunday woke up and passed gas and passed blood with some clot with it.  Had quite a bit of blood in the toilet water.  Last episode of rectal  bleeding was Tuesday but it was with a bowel movement. Has had intermittent rectal bleeding in the past. Maybe a every couple of months, but has never passed blood without having a bowel movement.  No real abdominal pain.  Nauseated today, but this is not typical.  No vomiting.  Reported celiac screen previously negative in [place].  Patient pulled up recent labs on her phone.  Hgb 15.8 on 02/13/24.     Past Medical History:  Diagnosis Date   Abnormal EKG    DDD (degenerative disc disease), lumbar    LBBB (left bundle branch block)    Lumbago with sciatica    Paresthesia of lower lip    Rheumatoid arthritis (HCC)    Scleritis    Vitamin D deficiency     Past Surgical History:  Procedure Laterality Date   CESAREAN SECTION  1995   LAMINECTOMY     2019   LAPAROSCOPIC HYSTERECTOMY     2010   NISSEN FUNDOPLICATION  2005   SPINAL CORD STIMULATOR INSERTION     20 21    Current Outpatient Medications  Medication Sig Dispense Refill   Cholecalciferol (VITAMIN D) 125 MCG (5000 UT) CAPS Take 1 capsule by mouth 3 (three) times a week.     levocetirizine (XYZAL ) 5 MG tablet TAKE 1 TABLET BY MOUTH EVERY DAY IN THE EVENING 30 tablet 5   Multiple Vitamin (MULTIVITAMIN ADULT PO) Take by mouth.     SUMAtriptan 6 MG/0.5ML SOAJ Inject into the skin daily.     tiZANidine (ZANAFLEX) 4 MG tablet 1 tablet as needed Orally  Three times a day     Aspirin-Acetaminophen -Caffeine (EXCEDRIN PO) Take 2 tablets by mouth as needed.     Continuous Glucose Sensor (FREESTYLE LIBRE 3 SENSOR) MISC Place 1 sensor on the skin every 14 days. Use to check glucose continuously (Patient not taking: Reported on 04/11/2024) 0.66 each 1   inFLIXimab-axxq (AVSOLA IV) Inject into the vein. (Patient not taking: Reported on 04/11/2024)     No current facility-administered medications for this visit.    Allergies as of 04/11/2024 - Review Complete 04/11/2024  Allergen Reaction Noted   Cephalosporins Hives, Diarrhea, and  Swelling 10/25/2023   Erythromycin Nausea And Vomiting 05/18/2022   Famotidine Nausea And Vomiting 10/25/2023   Nabumetone  05/18/2022   Penicillins Hives 05/18/2022   Wound dressing adhesive Other (See Comments) 08/03/2022    Family History  Problem Relation Age of Onset   Rheum arthritis Mother    Coronary artery disease Brother        found at autopsy after committing suicide   Suicidality Brother 49       2013   Rheum arthritis Maternal Grandmother     Social History   Socioeconomic History   Marital status: Divorced    Spouse name: Not on file   Number of children: Not on file   Years of education: Not on file   Highest education level: Not on file  Occupational History   Not on file  Tobacco Use   Smoking status: Former    Types: Cigarettes    Passive exposure: Past   Smokeless tobacco: Never  Vaping Use   Vaping status: Never Used  Substance and Sexual Activity   Alcohol use: Never   Drug use: Never   Sexual activity: Not Currently    Birth control/protection: Surgical  Other Topics Concern   Not on file  Social History Narrative   Not on file   Social Drivers of Health   Financial Resource Strain: Low Risk  (03/12/2024)   Received from [facility]   Overall Financial Resource Strain (CARDIA)    Difficulty of Paying Living Expenses: Not hard at all  Food Insecurity: No Food Insecurity (03/12/2024)   Received from [facility]   Hunger Vital Sign    Worried About Running Out of Food in the Last Year: Never true    Ran Out of Food in the Last Year: Never true  Transportation Needs: No Transportation Needs (03/12/2024)   Received from [facility] - Transportation    Lack of Transportation (Medical): No    Lack of Transportation (Non-Medical): No  Physical Activity: Not on file  Stress: Not on file  Social Connections: Not on file    Review of Systems: Gen: Denies fever, chills, cold or flulike symptoms, presyncope, syncope. CV: Denies  chest pain, palpitations. Resp: Denies dyspnea, cough. GI: See HPI Heme: See HPI  Physical Exam: BP 123/85 (BP Location: Right Arm, Patient Position: Sitting, Cuff Size: Normal)   Pulse 71   Temp 97.8 F (36.6 C) (Temporal)   Ht 4' 11 (1.499 m)   Wt 162 lb 9.6 oz (73.8 kg)   BMI 32.84 kg/m  General:   Alert and oriented. No distress noted. Pleasant and cooperative.  Head:  Normocephalic and atraumatic. Eyes:  Conjuctiva clear without scleral icterus. Heart:  S1, S2 present without murmurs appreciated. Lungs:  Clear to auscultation bilaterally. No wheezes, rales, or rhonchi. No distress.  Abdomen:  +BS, soft, non-tender and non-distended. No rebound or guarding. No  HSM or masses noted. Rectal: Prolapsing hemorrhoids, unable to perform internal exam due to significant pain posteriorly.  Msk:  Symmetrical without gross deformities. Normal posture. Extremities:  Without edema. Neurologic:  Alert and  oriented x4 Psych:  Normal mood and affect.    Assessment:  48 y.o. female with history of rheumatoid arthritis, LBBB, migraines, IBS, hemorrhoids, presenting today for follow-up of rectal pain/hemorrhoids/anal fissure, rectal bleeding, and bloating/diarrhea.   Rectal pain/hemorrhoids/anal fissure:  Hemorrhoids on rectal exam with suspected anal fissure due to reports of knifelike rectal pain and significant tenderness posteriorly with inability to perform internal exam on 02/01/2024.  She was prescribed [facility] hemorrhoid cream compounded with nitroglycerin  with improvement, but continues to have some mild intermittent symptoms though she has stopped using rectal cream.  Repeated rectal exam today with prolapsing hemorrhoids and still with inability to perform internal exam due to significant pain posteriorly.  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  Recommended resuming rectal cream, but will try her on nifedipine  rather than nitroglycerin .  Will also arrange  colonoscopy in the near future in light of rectal bleeding as discussed below.  Rectal bleeding:  Chronic intermittent rectal bleeding the previously with fairly infrequent symptoms occurring every couple of months.  Had initially reported rectal bleeding resolved with [facility] hemorrhoid cream compounded with nitroglycerin , but recently had an episode of rectal bleeding with passing gas only.  While it is still possible that bleeding is related to hemorrhoids and possible anal fissure, I am not able to rule out underlying polyps or malignancy, so I have recommended updating a colonoscopy.  I previously attempted to obtain colonoscopy records from [place] , but no records were received.  Recent hemoglobin on 02/13/24 was within normal limits at 15.8.  Will hold off on repeating hemoglobin while she has recurrent rectal bleeding.   Bloating/diarrhea/sucrase/amylase deficiency:  Significantly improved with Sucraid.   Plan:  Proceed with colonoscopy with propofol by Dr. Cindie in near future. The risks, benefits, and alternatives have been discussed with the patient in detail. The patient states understanding and desires to proceed.  ASA 2 [facility] hemorrhoid cream compounded with 0.2% nifedipine  4 times daily. Patient will let me know if she has recurrent rectal bleeding and we will update CBC at that time. Continue Sucraid Follow-up after colonoscopy.   Nancy Centers, PA-C [facility] 04/11/2024

## 2024-04-10 NOTE — Progress Notes (Unsigned)
 Referring Provider: Annella Kief, NP Primary Care Physician:  Annella Kief, NP Primary GI Physician: Dr. Mordechai April  Chief Complaint  Patient presents with   Follow-up    Follow up. Still having pains in rectum     HPI:   Nancy [name] is a 48 y.o. female with history of rheumatoid arthritis, LBBB, migraines, IBS, hemorrhoids, presenting today for follow-up of rectal pain/hemorrhoids/anal fissure, rectal bleeding, and IBS.   Initial consult 02/01/2024 for rectal pain, rectal bleeding.  On rectal exam, she had prolapsing internal hemorrhoids, quite a bit of tenderness when trying to examine posteriorly, and unable to complete internal exam due to discomfort.  She was prescribed [facility] hemorrhoid cream with nitroglycerin  for suspected anal fissure.  She was also prescribed dicyclomine  for IBS-D and recommended lactose-free diet.  At her last office visit 02/25/2024, rectal bleeding resolved.  Pain much improved.  Still using rectal cream 3-4 times a day.  Bloating and diarrhea also improved, having 2-3 bowel movements per day, but still having a lot of gas.  She was taking dicyclomine  2-3 times a day.  She had switched to lactose-free and was avoiding dairy for the most part.  Recommend continuing rectal cream, continue dicyclomine  and lactose-free diet, submit for free 4-day trial of Sucraid, and follow-up in 6 weeks.   Patient received Sucraid and I received notification that she responded well and requested to continue with prescription.   Today: Sharp knife like rectal pain has resolved. Some occasional symptoms, but not routinely.  Also with intermittent rectal itching.  Reports hemorrhoids are no longer protruding.   States, "Sucraide is amazing."  Having 1-2 Bristol 3-4 Bms daily.  Bloating improved.  Not taking dicyclomine .  Sunday woke up and passed gas and passed blood with some clot with it.  Had quite a bit of blood in the toilet water.  Last episode of rectal  bleeding was Tuesday but it was with a bowel movement. Has had intermittent rectal bleeding in the past. Maybe a every couple of months, but has never passed blood without having a bowel movement.  No real abdominal pain.  Nauseated today, but this is not typical.  No vomiting.  Reported celiac screen previously negative in [place].  Patient pulled up recent labs on her phone.  Hgb 15.8 on 02/13/24.     Past Medical History:  Diagnosis Date   Abnormal EKG    DDD (degenerative disc disease), lumbar    LBBB (left bundle branch block)    Lumbago with sciatica    Paresthesia of lower lip    Rheumatoid arthritis (HCC)    Scleritis    Vitamin D deficiency     Past Surgical History:  Procedure Laterality Date   CESAREAN SECTION  1995   LAMINECTOMY     2019   LAPAROSCOPIC HYSTERECTOMY     2010   NISSEN FUNDOPLICATION  2005   SPINAL CORD STIMULATOR INSERTION     20 21    Current Outpatient Medications  Medication Sig Dispense Refill   Cholecalciferol (VITAMIN D) 125 MCG (5000 UT) CAPS Take 1 capsule by mouth 3 (three) times a week.     levocetirizine (XYZAL ) 5 MG tablet TAKE 1 TABLET BY MOUTH EVERY DAY IN THE EVENING 30 tablet 5   Multiple Vitamin (MULTIVITAMIN ADULT PO) Take by mouth.     SUMAtriptan 6 MG/0.5ML SOAJ Inject into the skin daily.     tiZANidine (ZANAFLEX) 4 MG tablet 1 tablet as needed Orally  Three times a day     Aspirin-Acetaminophen -Caffeine (EXCEDRIN PO) Take 2 tablets by mouth as needed.     Continuous Glucose Sensor (FREESTYLE LIBRE 3 SENSOR) MISC Place 1 sensor on the skin every 14 days. Use to check glucose continuously (Patient not taking: Reported on 04/11/2024) 0.66 each 1   inFLIXimab-axxq (AVSOLA IV) Inject into the vein. (Patient not taking: Reported on 04/11/2024)     No current facility-administered medications for this visit.    Allergies as of 04/11/2024 - Review Complete 04/11/2024  Allergen Reaction Noted   Cephalosporins Hives, Diarrhea, and  Swelling 10/25/2023   Erythromycin Nausea And Vomiting 05/18/2022   Famotidine Nausea And Vomiting 10/25/2023   Nabumetone  05/18/2022   Penicillins Hives 05/18/2022   Wound dressing adhesive Other (See Comments) 08/03/2022    Family History  Problem Relation Age of Onset   Rheum arthritis Mother    Coronary artery disease Brother        found at autopsy after committing suicide   Suicidality Brother 49       2013   Rheum arthritis Maternal Grandmother     Social History   Socioeconomic History   Marital status: Divorced    Spouse name: Not on file   Number of children: Not on file   Years of education: Not on file   Highest education level: Not on file  Occupational History   Not on file  Tobacco Use   Smoking status: Former    Types: Cigarettes    Passive exposure: Past   Smokeless tobacco: Never  Vaping Use   Vaping status: Never Used  Substance and Sexual Activity   Alcohol use: Never   Drug use: Never   Sexual activity: Not Currently    Birth control/protection: Surgical  Other Topics Concern   Not on file  Social History Narrative   Not on file   Social Drivers of Health   Financial Resource Strain: Low Risk  (03/12/2024)   Received from [facility]   Overall Financial Resource Strain (CARDIA)    Difficulty of Paying Living Expenses: Not hard at all  Food Insecurity: No Food Insecurity (03/12/2024)   Received from [facility]   Hunger Vital Sign    Worried About Running Out of Food in the Last Year: Never true    Ran Out of Food in the Last Year: Never true  Transportation Needs: No Transportation Needs (03/12/2024)   Received from [facility] - Transportation    Lack of Transportation (Medical): No    Lack of Transportation (Non-Medical): No  Physical Activity: Not on file  Stress: Not on file  Social Connections: Not on file    Review of Systems: Gen: Denies fever, chills, cold or flulike symptoms, presyncope, syncope. CV: Denies  chest pain, palpitations. Resp: Denies dyspnea, cough. GI: See HPI Heme: See HPI  Physical Exam: BP 123/85 (BP Location: Right Arm, Patient Position: Sitting, Cuff Size: Normal)   Pulse 71   Temp 97.8 F (36.6 C) (Temporal)   Ht 4\' 11"  (1.499 m)   Wt 162 lb 9.6 oz (73.8 kg)   BMI 32.84 kg/m  General:   Alert and oriented. No distress noted. Pleasant and cooperative.  Head:  Normocephalic and atraumatic. Eyes:  Conjuctiva clear without scleral icterus. Heart:  S1, S2 present without murmurs appreciated. Lungs:  Clear to auscultation bilaterally. No wheezes, rales, or rhonchi. No distress.  Abdomen:  +BS, soft, non-tender and non-distended. No rebound or guarding. No  HSM or masses noted. Rectal: Prolapsing hemorrhoids, unable to perform internal exam due to significant pain posteriorly.  Msk:  Symmetrical without gross deformities. Normal posture. Extremities:  Without edema. Neurologic:  Alert and  oriented x4 Psych:  Normal mood and affect.    Assessment:  48 y.o. female with history of rheumatoid arthritis, LBBB, migraines, IBS, hemorrhoids, presenting today for follow-up of rectal pain/hemorrhoids/anal fissure, rectal bleeding, and bloating/diarrhea.   Rectal pain/hemorrhoids/anal fissure:  Hemorrhoids on rectal exam with suspected anal fissure due to reports of knifelike rectal pain and significant tenderness posteriorly with inability to perform internal exam on 02/01/2024.  She was prescribed [facility] hemorrhoid cream compounded with nitroglycerin  with improvement, but continues to have some mild intermittent symptoms though she has stopped using rectal cream.  Repeated rectal exam today with prolapsing hemorrhoids and still with inability to perform internal exam due to significant pain posteriorly.  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  Recommended resuming rectal cream, but will try her on nifedipine  rather than nitroglycerin .  Will also arrange  colonoscopy in the near future in light of rectal bleeding as discussed below.  Rectal bleeding:  Chronic intermittent rectal bleeding the previously with fairly infrequent symptoms occurring every couple of months.  Had initially reported rectal bleeding resolved with [facility] hemorrhoid cream compounded with nitroglycerin , but recently had an episode of rectal bleeding with passing gas only.  While it is still possible that bleeding is related to hemorrhoids and possible anal fissure, I am not able to rule out underlying polyps or malignancy, so I have recommended updating a colonoscopy.  I previously attempted to obtain colonoscopy records from [place] , but no records were received.  Recent hemoglobin on 02/13/24 was within normal limits at 15.8.  Will hold off on repeating hemoglobin while she has recurrent rectal bleeding.   Bloating/diarrhea/sucrase/amylase deficiency:  Significantly improved with Sucraid.   Plan:  Proceed with colonoscopy with propofol by Dr. Mordechai April in near future. The risks, benefits, and alternatives have been discussed with the patient in detail. The patient states understanding and desires to proceed.  ASA 2 [facility] hemorrhoid cream compounded with 0.2% nifedipine  4 times daily. Patient will let me know if she has recurrent rectal bleeding and we will update CBC at that time. Continue Sucraid Follow-up after colonoscopy.   Shana Daring, PA-C [facility] 04/11/2024

## 2024-04-11 ENCOUNTER — Encounter: Payer: Self-pay | Admitting: Gastroenterology

## 2024-04-11 ENCOUNTER — Ambulatory Visit: Admitting: Gastroenterology

## 2024-04-11 VITALS — BP 123/85 | HR 71 | Temp 97.8°F | Ht 59.0 in | Wt 162.6 lb

## 2024-04-11 DIAGNOSIS — K6289 Other specified diseases of anus and rectum: Secondary | ICD-10-CM | POA: Diagnosis not present

## 2024-04-11 DIAGNOSIS — R140 Abdominal distension (gaseous): Secondary | ICD-10-CM

## 2024-04-11 DIAGNOSIS — K625 Hemorrhage of anus and rectum: Secondary | ICD-10-CM

## 2024-04-11 DIAGNOSIS — E7439 Other disorders of intestinal carbohydrate absorption: Secondary | ICD-10-CM

## 2024-04-11 DIAGNOSIS — R197 Diarrhea, unspecified: Secondary | ICD-10-CM

## 2024-04-11 DIAGNOSIS — K602 Anal fissure, unspecified: Secondary | ICD-10-CM

## 2024-04-11 DIAGNOSIS — E7431 Sucrase-isomaltase deficiency: Secondary | ICD-10-CM

## 2024-04-11 DIAGNOSIS — K649 Unspecified hemorrhoids: Secondary | ICD-10-CM | POA: Diagnosis not present

## 2024-04-11 NOTE — Patient Instructions (Addendum)
 We will get you scheduled for a colonoscopy with Dr. Mordechai April at [facility].   Start rectal cream compounded with nifedipine  4 times a day. I am calling prescription to [facility].   If you have recurrent rectal bleeding, please let me know.   I will see you back after your colonoscopy.   Shana Daring, PA-C [facility]

## 2024-04-22 ENCOUNTER — Encounter: Payer: Self-pay | Admitting: *Deleted

## 2024-04-23 MED ORDER — PEG 3350-KCL-NA BICARB-NACL 420 G PO SOLR: 4000.0000 mL | Freq: Once | ORAL | 0 refills | Status: AC

## 2024-05-07 ENCOUNTER — Other Ambulatory Visit: Payer: Self-pay

## 2024-05-07 ENCOUNTER — Other Ambulatory Visit: Payer: Self-pay | Admitting: *Deleted

## 2024-05-07 ENCOUNTER — Ambulatory Visit (HOSPITAL_COMMUNITY)
Admission: RE | Admit: 2024-05-07 | Discharge: 2024-05-07 | Disposition: A | Discharge status: Home / Self Care | Attending: Internal Medicine | Admitting: Internal Medicine

## 2024-05-07 ENCOUNTER — Ambulatory Visit (HOSPITAL_COMMUNITY): Admitting: Certified Registered"

## 2024-05-07 ENCOUNTER — Encounter (HOSPITAL_COMMUNITY): Payer: Self-pay | Admitting: Internal Medicine

## 2024-05-07 ENCOUNTER — Encounter (HOSPITAL_COMMUNITY)
Admission: RE | Disposition: A | Discharge status: Home / Self Care | Payer: Self-pay | Source: Home / Self Care | Attending: Internal Medicine

## 2024-05-07 DIAGNOSIS — K6289 Other specified diseases of anus and rectum: Secondary | ICD-10-CM | POA: Diagnosis not present

## 2024-05-07 DIAGNOSIS — G43909 Migraine, unspecified, not intractable, without status migrainosus: Secondary | ICD-10-CM | POA: Diagnosis not present

## 2024-05-07 DIAGNOSIS — F419 Anxiety disorder, unspecified: Secondary | ICD-10-CM | POA: Insufficient documentation

## 2024-05-07 DIAGNOSIS — K648 Other hemorrhoids: Secondary | ICD-10-CM

## 2024-05-07 DIAGNOSIS — K644 Residual hemorrhoidal skin tags: Secondary | ICD-10-CM

## 2024-05-07 DIAGNOSIS — G4730 Sleep apnea, unspecified: Secondary | ICD-10-CM | POA: Diagnosis not present

## 2024-05-07 DIAGNOSIS — M069 Rheumatoid arthritis, unspecified: Secondary | ICD-10-CM | POA: Diagnosis not present

## 2024-05-07 DIAGNOSIS — I447 Left bundle-branch block, unspecified: Secondary | ICD-10-CM | POA: Diagnosis not present

## 2024-05-07 DIAGNOSIS — Z79899 Other long term (current) drug therapy: Secondary | ICD-10-CM | POA: Insufficient documentation

## 2024-05-07 DIAGNOSIS — D123 Benign neoplasm of transverse colon: Secondary | ICD-10-CM | POA: Diagnosis not present

## 2024-05-07 DIAGNOSIS — D124 Benign neoplasm of descending colon: Secondary | ICD-10-CM | POA: Diagnosis not present

## 2024-05-07 DIAGNOSIS — K602 Anal fissure, unspecified: Secondary | ICD-10-CM

## 2024-05-07 DIAGNOSIS — Z87891 Personal history of nicotine dependence: Secondary | ICD-10-CM | POA: Diagnosis not present

## 2024-05-07 DIAGNOSIS — F32A Depression, unspecified: Secondary | ICD-10-CM | POA: Insufficient documentation

## 2024-05-07 DIAGNOSIS — K589 Irritable bowel syndrome without diarrhea: Secondary | ICD-10-CM | POA: Diagnosis not present

## 2024-05-07 DIAGNOSIS — K625 Hemorrhage of anus and rectum: Secondary | ICD-10-CM | POA: Insufficient documentation

## 2024-05-07 HISTORY — DX: Sleep apnea, unspecified: G47.30

## 2024-05-07 HISTORY — DX: Depression, unspecified: F32.A

## 2024-05-07 HISTORY — PX: COLONOSCOPY: SHX5424

## 2024-05-07 HISTORY — DX: Anxiety disorder, unspecified: F41.9

## 2024-05-07 LAB — GLUCOSE, CAPILLARY: Glucose-Capillary: 95 mg/dL (ref 70–99)

## 2024-05-07 SURGERY — COLONOSCOPY
Anesthesia: General

## 2024-05-07 MED ORDER — LACTATED RINGERS IV SOLN: INTRAVENOUS | Status: DC

## 2024-05-07 MED ORDER — LACTATED RINGERS IV SOLN: INTRAVENOUS | Status: DC | PRN

## 2024-05-07 MED ORDER — LIDOCAINE 2% (20 MG/ML) 5 ML SYRINGE
INTRAMUSCULAR | Status: DC | PRN
  Administered 2024-05-07: 100 mg via INTRAVENOUS

## 2024-05-07 MED ORDER — PROPOFOL 500 MG/50ML IV EMUL
INTRAVENOUS | Status: DC | PRN
Start: 2024-05-07 — End: 2024-05-07
  Administered 2024-05-07: 150 ug/kg/min via INTRAVENOUS
  Administered 2024-05-07: 100 mg via INTRAVENOUS

## 2024-05-07 NOTE — Anesthesia Postprocedure Evaluation (Signed)
 Anesthesia Post Note  Patient: Nancy [name]  Procedure(s) Performed: COLONOSCOPY  Anesthesia Type: General Anesthetic complications: no   No notable events documented.   Last Vitals:  Vitals:   05/07/24 1224 05/07/24 1314  BP: 126/82 101/73  Pulse: 69 71  Resp: 20 20  Temp: 37.1 C 36.8 C  SpO2: 99% 99%    Last Pain:  Vitals:   05/07/24 1314  TempSrc: Oral  PainSc: 0-No pain                 Andrea Limes

## 2024-05-07 NOTE — Interval H&P Note (Signed)
 History and Physical Interval Note:  05/07/2024 12:28 PM  Nancy [name]  has presented today for surgery, with the diagnosis of RECTAL BLEEDING, ANAL FISSURE, RECTAL PAIN.  The various methods of treatment have been discussed with the patient and family. After consideration of risks, benefits and other options for treatment, the patient has consented to  Procedure(s) with comments: COLONOSCOPY (N/A) - 200pm, asa 2 as a surgical intervention.  The patient's history has been reviewed, patient examined, no change in status, stable for surgery.  I have reviewed the patient's chart and labs.  Questions were answered to the patient's satisfaction.     Carlin MARLA Hasty

## 2024-05-07 NOTE — Progress Notes (Signed)
 Per Dr. Cindie sent to colorectal surgery dx anal fissure.  Referral sent via [employer]icient

## 2024-05-07 NOTE — Anesthesia Procedure Notes (Signed)
 Date/Time: 05/07/2024 12:51 PM  Performed by: Para Jerelene CROME, CRNAOxygen Delivery Method: Nasal cannula

## 2024-05-07 NOTE — Transfer of Care (Signed)
 Immediate Anesthesia Transfer of Care Note  Patient: Nancy [name]  Procedure(s) Performed: COLONOSCOPY  Patient Location: Endoscopy Unit  Anesthesia Type:General  Level of Consciousness: drowsy and patient cooperative  Airway & Oxygen Therapy: Patient Spontanous Breathing  Post-op Assessment: Report given to RN and Post -op Vital signs reviewed and stable  Post vital signs: Reviewed and stable  Last Vitals:  Vitals Value Taken Time  BP 101/73 05/07/24 13:14  Temp 36.8 C 05/07/24 13:14  Pulse 71 05/07/24 13:14  Resp 20 05/07/24 13:14  SpO2 99 % 05/07/24 13:14    Last Pain:  Vitals:   05/07/24 1314  TempSrc: Oral  PainSc: 0-No pain         Complications: No notable events documented.

## 2024-05-07 NOTE — Anesthesia Preprocedure Evaluation (Addendum)
 Anesthesia Evaluation  Patient identified by MRN, date of birth, ID band Patient awake    Reviewed: Allergy & Precautions, H&P , NPO status , Patient's Chart, lab work & pertinent test results  Airway Mallampati: II  TM Distance: >3 FB Neck ROM: Full    Dental no notable dental hx.    Pulmonary sleep apnea , former smoker   Pulmonary exam normal breath sounds clear to auscultation       Cardiovascular negative cardio ROS Normal cardiovascular exam+ dysrhythmias  Rhythm:Regular Rate:Normal     Neuro/Psych  Headaches PSYCHIATRIC DISORDERS Anxiety Depression     Neuromuscular disease negative neurological ROS  negative psych ROS   GI/Hepatic negative GI ROS, Neg liver ROS,,,  Endo/Other  negative endocrine ROS    Renal/GU negative Renal ROS  negative genitourinary   Musculoskeletal negative musculoskeletal ROS (+) Arthritis ,    Abdominal   Peds negative pediatric ROS (+)  Hematology negative hematology ROS (+)   Anesthesia Other Findings   Reproductive/Obstetrics negative OB ROS                             Anesthesia Physical Anesthesia Plan  ASA: 2  Anesthesia Plan: General   Post-op Pain Management:    Induction: Intravenous  PONV Risk Score and Plan:   Airway Management Planned:   Additional Equipment:   Intra-op Plan:   Post-operative Plan:   Informed Consent: I have reviewed the patients History and Physical, chart, labs and discussed the procedure including the risks, benefits and alternatives for the proposed anesthesia with the patient or authorized representative who has indicated his/her understanding and acceptance.     Dental advisory given  Plan Discussed with: CRNA  Anesthesia Plan Comments:        Anesthesia Quick Evaluation

## 2024-05-07 NOTE — Discharge Instructions (Addendum)
  Colonoscopy Discharge Instructions  Read the instructions outlined below and refer to this sheet in the next few weeks. These discharge instructions provide you with general information on caring for yourself after you leave the hospital. Your doctor may also give you specific instructions. While your treatment has been planned according to the most current medical practices available, unavoidable complications occasionally occur.   ACTIVITY You may resume your regular activity, but move at a slower pace for the next 24 hours.  Take frequent rest periods for the next 24 hours.  Walking will help get rid of the air and reduce the bloated feeling in your belly (abdomen).  No driving for 24 hours (because of the medicine (anesthesia) used during the test).   Do not sign any important legal documents or operate any machinery for 24 hours (because of the anesthesia used during the test).  NUTRITION Drink plenty of fluids.  You may resume your normal diet as instructed by your doctor.  Begin with a light meal and progress to your normal diet. Heavy or fried foods are harder to digest and may make you feel sick to your stomach (nauseated).  Avoid alcoholic beverages for 24 hours or as instructed.  MEDICATIONS You may resume your normal medications unless your doctor tells you otherwise.  WHAT YOU CAN EXPECT TODAY Some feelings of bloating in the abdomen.  Passage of more gas than usual.  Spotting of blood in your stool or on the toilet paper.  IF YOU HAD POLYPS REMOVED DURING THE COLONOSCOPY: No aspirin products for 7 days or as instructed.  No alcohol for 7 days or as instructed.  Eat a soft diet for the next 24 hours.  FINDING OUT THE RESULTS OF YOUR TEST Not all test results are available during your visit. If your test results are not back during the visit, make an appointment with your caregiver to find out the results. Do not assume everything is normal if you have not heard from your  caregiver or the medical facility. It is important for you to follow up on all of your test results.  SEEK IMMEDIATE MEDICAL ATTENTION IF: You have more than a spotting of blood in your stool.  Your belly is swollen (abdominal distention).  You are nauseated or vomiting.  You have a temperature over 101.  You have abdominal pain or discomfort that is severe or gets worse throughout the day. .ccpo  Your colonoscopy revealed 2 polyp(s) which I removed successfully. Await pathology results, my office will contact you. I recommend repeating colonoscopy in 7 years for surveillance purposes.   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  I think you would benefit from evaluation from colorectal surgery and I will make that referral today.  Follow-up in GI office in 2 to 3 months. Message sent to the office and will schedule this appointment.   I hope you have a great rest of your week!  Carlin POUR. Cindie, D.O. Gastroenterology and Hepatology [facility]

## 2024-05-07 NOTE — Op Note (Signed)
 [facility] Patient Name: Nancy [name] Procedure Date: 05/07/2024 12:18 PM MRN: [id number] Date of Birth: 05-12-1976 Attending MD: Carlin POUR. Cindie , [place], [id number] CSN: [id number] Age: 48 Admit Type: Outpatient Procedure:                Colonoscopy Indications:              Rectal bleeding, Rectal pain Providers:                Carlin POUR. Cindie, DO, Jon LABOR. Museum/gallery exhibitions officer, RN,                            [employer], RN, Kristine L. Shirlean Balm, Technician Referring MD:              Medicines:                See the Anesthesia note for documentation of the                            administered medications Complications:            No immediate complications. Estimated Blood Loss:     Estimated blood loss was minimal. Procedure:                Pre-Anesthesia Assessment:                           - The anesthesia plan was to use monitored                            anesthesia care (MAC).                           After obtaining informed consent, the colonoscope                            was passed under direct vision. Throughout the                            procedure, the patient's blood pressure, pulse, and                            oxygen saturations were monitored continuously. The                            [id number]-HQ190L ([id number]) scope was introduced through                            the anus and advanced to the the terminal ileum,                            with identification of the appendiceal orifice and                            IC valve. The colonoscopy was performed without  difficulty. The patient tolerated the procedure                            well. The quality of the bowel preparation was                            evaluated using the BBPS [facility] Bowel Preparation                            Scale) with scores of: Right Colon = 3, Transverse                            Colon = 3 and Left Colon = 3 (entire mucosa seen                             well with no residual staining, small fragments of                            stool or opaque liquid). The total BBPS score                            equals 9. Scope In: 12:56:59 PM Scope Out: 1:09:02 PM Scope Withdrawal Time: 0 hours 9 minutes 8 seconds  Total Procedure Duration: 0 hours 12 minutes 3 seconds  Findings:      An anal fissure was found on perianal exam.      Hemorrhoids were found on perianal exam.      Non-bleeding internal hemorrhoids were found during retroflexion.      Two sessile polyps were found in the descending colon and transverse       colon. The polyps were 4 to 5 mm in size. These polyps were removed with       a cold snare. Resection and retrieval were complete.      The terminal ileum appeared normal. Impression:               - Anal fissure found on perianal exam.                           - Hemorrhoids found on perianal exam.                           - Non-bleeding internal hemorrhoids.                           - Two 4 to 5 mm polyps in the descending colon and                            in the transverse colon, removed with a cold snare.                            Resected and retrieved.                           - The examined portion of the ileum was normal. Moderate Sedation:  Per Anesthesia Care Recommendation:           - Patient has a contact number available for                            emergencies. The signs and symptoms of potential                            delayed complications were discussed with the                            patient. Return to normal activities tomorrow.                            Written discharge instructions were provided to the                            patient.                           - Resume previous diet.                           - Continue present medications.                           - Await pathology results.                           - Repeat colonoscopy in 7 years for  surveillance.                           - Patient has now failed topical NTG cream as well                            as Nifedipine . Consider referral to colorectal                            surgery for more definitive treatment of her anal                            fissure. Procedure Code(s):        --- Professional ---                           [phone], Colonoscopy, flexible; with removal of                            tumor(s), polyp(s), or other lesion(s) by snare                            technique Diagnosis Code(s):        --- Professional ---                           D12.4, Benign neoplasm of descending colon  D12.3, Benign neoplasm of transverse colon (hepatic                            flexure or splenic flexure)                           K64.8, Other hemorrhoids                           K60.2, Anal fissure, unspecified                           K62.5, Hemorrhage of anus and rectum                           K62.89, Other specified diseases of anus and rectum CPT copyright 2022 American Medical Association. All rights reserved. The codes documented in this report are preliminary and upon coder review may  be revised to meet current compliance requirements. Carlin POUR. Cindie, DO Carlin POUR. Kamira Mellette, DO 05/07/2024 1:20:01 PM This report has been signed electronically. Number of Addenda: 0

## 2024-05-08 ENCOUNTER — Encounter (HOSPITAL_COMMUNITY): Payer: Self-pay | Admitting: Internal Medicine

## 2024-05-08 LAB — SURGICAL PATHOLOGY

## 2024-05-13 ENCOUNTER — Encounter (HOSPITAL_BASED_OUTPATIENT_CLINIC_OR_DEPARTMENT_OTHER): Payer: Self-pay | Admitting: *Deleted

## 2024-05-13 ENCOUNTER — Other Ambulatory Visit: Payer: Self-pay

## 2024-05-13 NOTE — Progress Notes (Signed)
   05/13/24 1418  PAT Phone Screen  Is the patient taking a GLP-1 receptor agonist? No  Do You Have Diabetes? No  Do You Have Hypertension? No  Have You Ever Been to the ER for Asthma? No  Have You Taken Oral Steroids in the Past 3 Months? No  Do you Take Phenteramine or any Other Diet Drugs? No  Recent  Lab Work, EKG, CXR? No  Do you have a history of heart problems? No  Cardiologist Name consult for cards clearance w/ Dr Shlomo in 2023 prior to back surgery due to LBBB  Have you ever had tests on your heart? Yes  What cardiac tests were performed? Echo  What date/year were cardiac tests completed? 06/09/22 EF 50-55%      CTA 06/23/22 normal findings.  Results viewable: [facility] [employer] Tab  Any Recent Hospitalizations? No  Height 4' 11.49 (1.511 m)  Weight 72.6 kg  Pat Appointment Scheduled Yes (EKG)   Reviewed history w/ Dr Cleotilde, No additional clearance needed prior to surgery.

## 2024-05-16 ENCOUNTER — Encounter (HOSPITAL_BASED_OUTPATIENT_CLINIC_OR_DEPARTMENT_OTHER)
Admission: RE | Admit: 2024-05-16 | Discharge: 2024-05-16 | Disposition: A | Discharge status: Home / Self Care | Source: Ambulatory Visit | Attending: Otolaryngology | Admitting: Otolaryngology

## 2024-05-16 DIAGNOSIS — Z01810 Encounter for preprocedural cardiovascular examination: Secondary | ICD-10-CM | POA: Insufficient documentation

## 2024-05-16 DIAGNOSIS — I2510 Atherosclerotic heart disease of native coronary artery without angina pectoris: Secondary | ICD-10-CM | POA: Insufficient documentation

## 2024-05-16 DIAGNOSIS — J320 Chronic maxillary sinusitis: Secondary | ICD-10-CM | POA: Diagnosis not present

## 2024-05-16 DIAGNOSIS — R9431 Abnormal electrocardiogram [ECG] [EKG]: Secondary | ICD-10-CM | POA: Diagnosis not present

## 2024-05-16 DIAGNOSIS — Z01818 Encounter for other preprocedural examination: Secondary | ICD-10-CM | POA: Diagnosis present

## 2024-05-16 DIAGNOSIS — J342 Deviated nasal septum: Secondary | ICD-10-CM | POA: Diagnosis not present

## 2024-05-16 DIAGNOSIS — J343 Hypertrophy of nasal turbinates: Secondary | ICD-10-CM | POA: Diagnosis not present

## 2024-05-16 DIAGNOSIS — J3489 Other specified disorders of nose and nasal sinuses: Secondary | ICD-10-CM | POA: Diagnosis not present

## 2024-05-17 NOTE — Progress Notes (Signed)
 EKG reviewed by Dr. Keneth, will proceed with surgery as scheduled.

## 2024-05-20 ENCOUNTER — Encounter (HOSPITAL_BASED_OUTPATIENT_CLINIC_OR_DEPARTMENT_OTHER)
Admission: RE | Disposition: A | Discharge status: Home / Self Care | Payer: Self-pay | Source: Home / Self Care | Attending: Otolaryngology

## 2024-05-20 ENCOUNTER — Ambulatory Visit (HOSPITAL_BASED_OUTPATIENT_CLINIC_OR_DEPARTMENT_OTHER): Admitting: Certified Registered"

## 2024-05-20 ENCOUNTER — Encounter (HOSPITAL_BASED_OUTPATIENT_CLINIC_OR_DEPARTMENT_OTHER): Payer: Self-pay

## 2024-05-20 ENCOUNTER — Ambulatory Visit (HOSPITAL_BASED_OUTPATIENT_CLINIC_OR_DEPARTMENT_OTHER)
Admission: RE | Admit: 2024-05-20 | Discharge: 2024-05-20 | Disposition: A | Discharge status: Home / Self Care | Attending: Otolaryngology | Admitting: Otolaryngology

## 2024-05-20 ENCOUNTER — Emergency Department (HOSPITAL_COMMUNITY)
Admission: EM | Admit: 2024-05-20 | Discharge: 2024-05-20 | Disposition: A | Discharge status: Home / Self Care | Attending: Emergency Medicine | Admitting: Emergency Medicine

## 2024-05-20 ENCOUNTER — Other Ambulatory Visit: Payer: Self-pay

## 2024-05-20 DIAGNOSIS — J342 Deviated nasal septum: Secondary | ICD-10-CM

## 2024-05-20 DIAGNOSIS — J320 Chronic maxillary sinusitis: Secondary | ICD-10-CM

## 2024-05-20 DIAGNOSIS — T170XXA Foreign body in nasal sinus, initial encounter: Secondary | ICD-10-CM | POA: Insufficient documentation

## 2024-05-20 DIAGNOSIS — X58XXXA Exposure to other specified factors, initial encounter: Secondary | ICD-10-CM | POA: Diagnosis not present

## 2024-05-20 DIAGNOSIS — R0981 Nasal congestion: Secondary | ICD-10-CM | POA: Diagnosis not present

## 2024-05-20 DIAGNOSIS — R040 Epistaxis: Secondary | ICD-10-CM | POA: Insufficient documentation

## 2024-05-20 DIAGNOSIS — J343 Hypertrophy of nasal turbinates: Secondary | ICD-10-CM | POA: Diagnosis present

## 2024-05-20 DIAGNOSIS — G4730 Sleep apnea, unspecified: Secondary | ICD-10-CM | POA: Insufficient documentation

## 2024-05-20 DIAGNOSIS — J3489 Other specified disorders of nose and nasal sinuses: Secondary | ICD-10-CM

## 2024-05-20 DIAGNOSIS — I2510 Atherosclerotic heart disease of native coronary artery without angina pectoris: Secondary | ICD-10-CM

## 2024-05-20 DIAGNOSIS — Z87891 Personal history of nicotine dependence: Secondary | ICD-10-CM | POA: Diagnosis not present

## 2024-05-20 HISTORY — PX: SINUS ENDO WITH FUSION: SHX5329

## 2024-05-20 HISTORY — DX: Cardiac murmur, unspecified: R01.1

## 2024-05-20 HISTORY — DX: Other complications of anesthesia, initial encounter: T88.59XA

## 2024-05-20 LAB — CBC
HCT: 44.4 % (ref 36.0–46.0)
Hemoglobin: 14.9 g/dL (ref 12.0–15.0)
MCH: 31 pg (ref 26.0–34.0)
MCHC: 33.6 g/dL (ref 30.0–36.0)
MCV: 92.5 fL (ref 80.0–100.0)
Platelets: 313 K/uL (ref 150–400)
RBC: 4.8 MIL/uL (ref 3.87–5.11)
RDW: 13.3 % (ref 11.5–15.5)
WBC: 14.4 K/uL — ABNORMAL HIGH (ref 4.0–10.5)
nRBC: 0 % (ref 0.0–0.2)

## 2024-05-20 LAB — BASIC METABOLIC PANEL WITH GFR
Anion gap: 11 (ref 5–15)
BUN: 7 mg/dL (ref 6–20)
CO2: 21 mmol/L — ABNORMAL LOW (ref 22–32)
Calcium: 9.5 mg/dL (ref 8.9–10.3)
Chloride: 104 mmol/L (ref 98–111)
Creatinine, Ser: 0.85 mg/dL (ref 0.44–1.00)
GFR, Estimated: >60 mL/min (ref >60)
Glucose, Bld: 123 mg/dL — ABNORMAL HIGH (ref 70–99)
Potassium: 4.2 mmol/L (ref 3.5–5.1)
Sodium: 136 mmol/L (ref 135–145)

## 2024-05-20 SURGERY — SURGERY, PARANASAL SINUS, ENDOSCOPIC, WITH NASAL SEPTOPLASTY, TURBINOPLASTY, AND MAXILLARY SINUSOTOMY
Anesthesia: General | Site: Nose

## 2024-05-20 MED ORDER — EPINEPHRINE HCL (NASAL) 0.1 % NA SOLN
NASAL | Status: DC | PRN
  Administered 2024-05-20: 10 mL via NASAL

## 2024-05-20 MED ORDER — ONDANSETRON HCL 4 MG/2ML IJ SOLN
INTRAMUSCULAR | Status: DC | PRN
Start: 2024-05-20 — End: 2024-05-20
  Administered 2024-05-20: 4 mg via INTRAVENOUS

## 2024-05-20 MED ORDER — ONDANSETRON HCL 4 MG/2ML IJ SOLN: 4.0000 mg | Freq: Once | INTRAMUSCULAR | Status: DC | PRN

## 2024-05-20 MED ORDER — DEXAMETHASONE SODIUM PHOSPHATE 10 MG/ML IJ SOLN
INTRAMUSCULAR | Status: AC
  Filled 2024-05-20: qty 1

## 2024-05-20 MED ORDER — FENTANYL CITRATE (PF) 100 MCG/2ML IJ SOLN
25.0000 ug | INTRAMUSCULAR | Status: DC | PRN
  Administered 2024-05-20 (×2): 50 ug via INTRAVENOUS

## 2024-05-20 MED ORDER — ONDANSETRON HCL 4 MG/2ML IJ SOLN
INTRAMUSCULAR | Status: AC
  Filled 2024-05-20: qty 2

## 2024-05-20 MED ORDER — SUGAMMADEX SODIUM 200 MG/2ML IV SOLN
INTRAVENOUS | Status: AC
  Filled 2024-05-20: qty 2

## 2024-05-20 MED ORDER — DEXAMETHASONE SODIUM PHOSPHATE 10 MG/ML IJ SOLN
INTRAMUSCULAR | Status: DC | PRN
Start: 2024-05-20 — End: 2024-05-20
  Administered 2024-05-20: 10 mg via INTRAVENOUS

## 2024-05-20 MED ORDER — 0.9 % SODIUM CHLORIDE (POUR BTL) OPTIME
TOPICAL | Status: DC | PRN
  Administered 2024-05-20: 250 mL

## 2024-05-20 MED ORDER — PREDNISONE 10 MG PO TABS: 10.0000 mg | ORAL_TABLET | Freq: Every day | ORAL | 0 refills | Status: AC

## 2024-05-20 MED ORDER — FLUORESCEIN SODIUM 1 MG OP STRP
ORAL_STRIP | OPHTHALMIC | Status: DC | PRN
  Administered 2024-05-20: 1

## 2024-05-20 MED ORDER — EPHEDRINE SULFATE (PRESSORS) 50 MG/ML IJ SOLN
INTRAMUSCULAR | Status: DC | PRN
  Administered 2024-05-20 (×2): 5 mg via INTRAVENOUS

## 2024-05-20 MED ORDER — MUPIROCIN 2 % EX OINT
TOPICAL_OINTMENT | CUTANEOUS | Status: DC | PRN
  Administered 2024-05-20: 1 via TOPICAL

## 2024-05-20 MED ORDER — SUGAMMADEX SODIUM 200 MG/2ML IV SOLN
INTRAVENOUS | Status: DC | PRN
  Administered 2024-05-20: 200 mg via INTRAVENOUS

## 2024-05-20 MED ORDER — LIDOCAINE-EPINEPHRINE 1 %-1:100000 IJ SOLN
INTRAMUSCULAR | Status: DC | PRN
  Administered 2024-05-20: 13.5 mL

## 2024-05-20 MED ORDER — LIDOCAINE 2% (20 MG/ML) 5 ML SYRINGE
INTRAMUSCULAR | Status: DC | PRN
Start: 2024-05-20 — End: 2024-05-20
  Administered 2024-05-20: 100 mg via INTRAVENOUS

## 2024-05-20 MED ORDER — CLINDAMYCIN PHOSPHATE 900 MG/50ML IV SOLN
INTRAVENOUS | Status: DC | PRN
  Administered 2024-05-20: 900 mg via INTRAVENOUS

## 2024-05-20 MED ORDER — CLINDAMYCIN PHOSPHATE 900 MG/50ML IV SOLN
INTRAVENOUS | Status: AC
  Filled 2024-05-20: qty 50

## 2024-05-20 MED ORDER — FENTANYL CITRATE (PF) 100 MCG/2ML IJ SOLN
INTRAMUSCULAR | Status: AC
  Filled 2024-05-20: qty 2

## 2024-05-20 MED ORDER — DOXYCYCLINE HYCLATE 100 MG PO TABS: 100.0000 mg | ORAL_TABLET | Freq: Two times a day (BID) | ORAL | 0 refills | Status: AC

## 2024-05-20 MED ORDER — MIDAZOLAM HCL 2 MG/2ML IJ SOLN
INTRAMUSCULAR | Status: AC
  Filled 2024-05-20: qty 2

## 2024-05-20 MED ORDER — ROCURONIUM BROMIDE 100 MG/10ML IV SOLN
INTRAVENOUS | Status: DC | PRN
  Administered 2024-05-20: 10 mg via INTRAVENOUS
  Administered 2024-05-20: 60 mg via INTRAVENOUS

## 2024-05-20 MED ORDER — EPHEDRINE 5 MG/ML INJ
INTRAVENOUS | Status: AC
  Filled 2024-05-20: qty 10

## 2024-05-20 MED ORDER — LACTATED RINGERS IV SOLN: INTRAVENOUS | Status: DC

## 2024-05-20 MED ORDER — SODIUM CHLORIDE 0.9 % IR SOLN
Status: DC | PRN
  Administered 2024-05-20: 100 mL

## 2024-05-20 MED ORDER — IBUPROFEN 200 MG PO TABS: 400.0000 mg | ORAL_TABLET | Freq: Four times a day (QID) | ORAL | 2 refills | Status: AC | PRN

## 2024-05-20 MED ORDER — ACETAMINOPHEN 10 MG/ML IV SOLN
1000.0000 mg | Freq: Once | INTRAVENOUS | Status: DC | PRN
  Administered 2024-05-20: 1000 mg via INTRAVENOUS

## 2024-05-20 MED ORDER — MIDAZOLAM HCL 5 MG/5ML IJ SOLN
INTRAMUSCULAR | Status: DC | PRN
  Administered 2024-05-20: 2 mg via INTRAVENOUS

## 2024-05-20 MED ORDER — TRAMADOL HCL 50 MG PO TABS: 50.0000 mg | ORAL_TABLET | Freq: Four times a day (QID) | ORAL | 0 refills | Status: AC | PRN

## 2024-05-20 MED ORDER — FENTANYL CITRATE (PF) 100 MCG/2ML IJ SOLN
INTRAMUSCULAR | Status: DC | PRN
  Administered 2024-05-20: 100 ug via INTRAVENOUS

## 2024-05-20 MED ORDER — ACETAMINOPHEN 500 MG PO TABS: 1000.0000 mg | ORAL_TABLET | Freq: Four times a day (QID) | ORAL | 2 refills | Status: AC | PRN

## 2024-05-20 MED ORDER — ROCURONIUM BROMIDE 10 MG/ML (PF) SYRINGE
PREFILLED_SYRINGE | INTRAVENOUS | Status: AC
  Filled 2024-05-20: qty 10

## 2024-05-20 MED ORDER — ACETAMINOPHEN 10 MG/ML IV SOLN
INTRAVENOUS | Status: AC
  Filled 2024-05-20: qty 100

## 2024-05-20 MED ORDER — PROPOFOL 10 MG/ML IV BOLUS
INTRAVENOUS | Status: DC | PRN
  Administered 2024-05-20: 110 mg via INTRAVENOUS

## 2024-05-20 SURGICAL SUPPLY — 66 items
BALLOON FRONTAL NUVENT 6X17 (BALLOONS) IMPLANT
BALLOON FRONTAL NVNT 6X17 70D (BALLOONS) IMPLANT
BLADE INF TURB ROT M4 2 5PK (BLADE) IMPLANT
BLADE NAVIG QUADCUT 4.3X13 M4 (BLADE) IMPLANT
BLADE PED QUAD 3.4 EM (BLADE) IMPLANT
BLADE RAD60 ROTATE M4 4 5PK (BLADE) IMPLANT
BLADE ROTATE RAD 40 4 M4 (BLADE) IMPLANT
BLADE ROTATE TRICUT 4X13 M4 (BLADE) IMPLANT
BLADE SHAVER TURBINATE 11X2.9 (BLADE) IMPLANT
BLADE SURG 15 STRL LF DISP TIS (BLADE) IMPLANT
CANISTER SUC SOCK COL 7IN (MISCELLANEOUS) ×1 IMPLANT
CANISTER SUCT 1200ML W/VALVE (MISCELLANEOUS) ×2 IMPLANT
COAGULATOR SUCT 8FR VV (MISCELLANEOUS) IMPLANT
DEFOGGER MIRROR 1QT (MISCELLANEOUS) IMPLANT
DRESSING NASAL KENNEDY 3.5X.9 (MISCELLANEOUS) IMPLANT
DRSG NASOPORE 8CM (GAUZE/BANDAGES/DRESSINGS) IMPLANT
ELECT COATED BLADE 2.86 ST (ELECTRODE) IMPLANT
ELECTRODE REM PT RTRN 9FT ADLT (ELECTROSURGICAL) ×1 IMPLANT
GLOVE BIO SURGEON STRL SZ 6.5 (GLOVE) ×1 IMPLANT
GLOVE BIOGEL PI IND STRL 7.5 (GLOVE) IMPLANT
GLOVE SURG SS PI 7.0 STRL IVOR (GLOVE) IMPLANT
GOWN STRL REUS W/ TWL LRG LVL3 (GOWN DISPOSABLE) ×2 IMPLANT
HEMOSTAT ARISTA ABSORB 3G PWDR (HEMOSTASIS) IMPLANT
HEMOSTAT SNOW SURGICEL 2X4 (HEMOSTASIS) IMPLANT
HEMOSTAT SURGICEL .5X2 ABSORB (HEMOSTASIS) IMPLANT
HEMOSTAT SURGICEL 2X14 (HEMOSTASIS) IMPLANT
INFLATOR BALLOON W/TUBE (BALLOONS) IMPLANT
IV NS 250ML BAXH (IV SOLUTION) IMPLANT
IV NS 500ML BAXH (IV SOLUTION) ×1 IMPLANT
MANIFOLD NEPTUNE II (INSTRUMENTS) IMPLANT
NDL HYPO 25X1 1.5 SAFETY (NEEDLE) ×2 IMPLANT
NDL SAFETY ECLIPSE 18X1.5 (NEEDLE) IMPLANT
NDL SPNL 25GX3.5 QUINCKE BL (NEEDLE) ×1 IMPLANT
NEEDLE HYPO 25X1 1.5 SAFETY (NEEDLE) ×2 IMPLANT
NEEDLE SPNL 25GX3.5 QUINCKE BL (NEEDLE) ×1 IMPLANT
NS IRRIG 1000ML POUR BTL (IV SOLUTION) ×1 IMPLANT
PACK BASIN DAY SURGERY FS (CUSTOM PROCEDURE TRAY) ×1 IMPLANT
PACK ENT DAY SURGERY (CUSTOM PROCEDURE TRAY) ×1 IMPLANT
PATTIES SURGICAL .5 X3 (DISPOSABLE) ×1 IMPLANT
PENCIL SMOKE EVACUATOR (MISCELLANEOUS) IMPLANT
SHEATH ENDOSCRUB 0 DEG (SHEATH) IMPLANT
SHEATH ENDOSCRUB 30 DEG (SHEATH) IMPLANT
SHEET MEDIUM DRAPE 40X70 STRL (DRAPES) IMPLANT
SLEEVE SCD COMPRESS KNEE MED (STOCKING) ×1 IMPLANT
SOLUTION ANTFG W/FOAM PAD STRL (MISCELLANEOUS) ×1 IMPLANT
SPLINT NASAL AIRWAY SILICONE (MISCELLANEOUS) IMPLANT
SPLINT NASAL POSISEP X .6X2 (GAUZE/BANDAGES/DRESSINGS) IMPLANT
SUT CHROMIC 2 0 SH (SUTURE) IMPLANT
SUT CHROMIC 4 0 RB 1X27 (SUTURE) IMPLANT
SUT PLAIN 4 0 ~~LOC~~ 1 (SUTURE) IMPLANT
SUT PROLENE 3 0 PS 1 (SUTURE) IMPLANT
SUT PROLENE 3 0 PS 2 (SUTURE) ×1 IMPLANT
SUT VICRYL RAPIDE 4/0 PS 2 (SUTURE) ×1 IMPLANT
SWAB COLLECTION DEVICE MRSA (MISCELLANEOUS) IMPLANT
SWAB CULTURE ESWAB REG 1ML (MISCELLANEOUS) IMPLANT
SYR 10ML LL (SYRINGE) ×1 IMPLANT
SYR 3ML 18GX1 1/2 (SYRINGE) ×2 IMPLANT
SYR TB 1ML LL NO SAFETY (SYRINGE) IMPLANT
TOWEL GREEN STERILE FF (TOWEL DISPOSABLE) ×1 IMPLANT
TRACKER ENT INSTRUMENT (MISCELLANEOUS) ×1 IMPLANT
TRACKER ENT PATIENT (MISCELLANEOUS) ×1 IMPLANT
TUBE CONNECTING 20X1/4 (TUBING) ×1 IMPLANT
TUBE SALEM SUMP 12FR 48 (TUBING) IMPLANT
TUBE SALEM SUMP 16F (TUBING) ×1 IMPLANT
TUBING STRAIGHTSHOT EPS 5PK (TUBING) IMPLANT
YANKAUER SUCT BULB TIP NO VENT (SUCTIONS) ×1 IMPLANT

## 2024-05-20 NOTE — Transfer of Care (Signed)
 Immediate Anesthesia Transfer of Care Note  Patient: Nancy [name]  Procedure(s) Performed: SEPTOPLASTY, BILATERAL INFERIOR TURBINATE REDUCTION, LEFT MAXILLARY ANTROSTOMY WITH TISSUE REMOVAL USING STEALTH NAVIGATION (Nose)  Patient Location: PACU  Anesthesia Type:General  Level of Consciousness: awake, alert , and oriented  Airway & Oxygen Therapy: Patient Spontanous Breathing and Patient connected to face mask oxygen  Post-op Assessment: Report given to RN and Post -op Vital signs reviewed and stable  Post vital signs: Reviewed and stable  Last Vitals:  Vitals Value Taken Time  BP 138/77 05/20/24 11:54  Temp    Pulse 76 05/20/24 11:57  Resp 16   SpO2 98 % 05/20/24 11:57  Vitals shown include unfiled device data.  Last Pain:  Vitals:   05/20/24 0757  TempSrc: Temporal  PainSc: 0-No pain         Complications: No notable events documented.

## 2024-05-20 NOTE — ED Provider Triage Note (Signed)
 Emergency Medicine Provider Triage Evaluation Note  Nancy [name] , a 48 y.o. female  was evaluated in triage.  Pt complains of nosebleed.  Patient had septoplasty completed this morning, around 5:30 PM she began to develop a nosebleed, R nare > L.  She reports that the bleeding has slowed at this time but according to her discharge instructions she was told to come to the emergency department if her bleeding lasted longer than 10 minutes, which it has.  She is not on blood thinners.  Pain is well-controlled, she took a tramadol  earlier today.  Review of Systems  Positive: As above Negative: As above  Physical Exam  BP (!) 140/98 (BP Location: Left Arm)   Pulse 81   Temp 98 F (36.7 C) (Oral)   Resp 14   SpO2 98%  Gen:   Awake, no distress   Resp:  Normal effort  MSK:   Moves extremities without difficulty  Other:  Active but slow bleeding from R nare, blood clot/dried blood  visualized  Medical Decision Making  Medically screening exam initiated at 9:00 PM.  Appropriate orders placed.  Nancy [name] was informed that the remainder of the evaluation will be completed by another provider, this initial triage assessment does not replace that evaluation, and the importance of remaining in the ED until their evaluation is complete.     Nancy [name], [place] 05/20/24 2102

## 2024-05-20 NOTE — Discharge Instructions (Signed)
 Today you were seen for a nosebleed after septoplasty.  Please continue to leave the 4 x 4's taped under your nose, place a new set of 4 x 4's if you bleed through what is currently taped.  If you go through more than 4 of these in the next 24 hours please contact the ENT on-call for further guidance.  Thank you for letting us  treat you today. After reviewing your labs, I feel you are safe to go home. Please follow up with your PCP in the next several days and provide them with your records from this visit. Return to the Emergency Room if pain becomes severe or symptoms worsen.

## 2024-05-20 NOTE — ED Provider Notes (Signed)
 [place] EMERGENCY DEPARTMENT AT [facility] Provider Note   CSN: [id number] Arrival date & time: 05/20/24  1955     Patient presents with: Epistaxis S/P Septoplasty Today    Nancy [name] is a 48 y.o. female presents today for right nare bleeding that began around 530 this afternoon.  Patient had a septoplasty by Dr. Tobie with the ENT earlier today.  Patient denies nausea, vomiting, blood disorder, blood thinners, fever, chills, dizziness, or any other complaints at this time.   HPI     Prior to Admission medications   Medication Sig Start Date End Date Taking? Authorizing Provider  acetaminophen  (TYLENOL ) 500 MG tablet Take 2 tablets (1,000 mg total) by mouth every 6 (six) hours as needed. 05/20/24 05/20/25  Tobie Eldora NOVAK, MD  Cholecalciferol (VITAMIN D) 125 MCG (5000 UT) CAPS Take 1 capsule by mouth 3 (three) times a week.    [provider]  Continuous Glucose Sensor (FREESTYLE LIBRE 3 SENSOR) MISC Place 1 sensor on the skin every 14 days. Use to check glucose continuously Patient not taking: Reported on 04/11/2024 10/04/23   Early, Camie BRAVO, NP  doxycycline  (VIBRA -TABS) 100 MG tablet Take 1 tablet (100 mg total) by mouth 2 (two) times daily for 10 days. 05/20/24 05/30/24  Tobie Eldora NOVAK, MD  ibuprofen  (MOTRIN  IB) 200 MG tablet Take 2 tablets (400 mg total) by mouth every 6 (six) hours as needed. 05/20/24 05/20/25  Tobie Eldora NOVAK, MD  inFLIXimab-axxq (AVSOLA IV) Inject into the vein.    [provider]  levocetirizine (XYZAL ) 5 MG tablet TAKE 1 TABLET BY MOUTH EVERY DAY IN THE EVENING 02/27/24   Early, Sara E, NP  Multiple Vitamin (MULTIVITAMIN ADULT PO) Take by mouth.    [provider]  predniSONE  (DELTASONE ) 10 MG tablet Take 1 tablet (10 mg total) by mouth daily for 10 days. 05/20/24 05/30/24  Patel, Kunjan B, MD  SUMAtriptan 6 MG/0.5ML SOAJ Inject into the skin daily. 09/08/22   [provider]  tiZANidine (ZANAFLEX) 4 MG tablet 1  tablet as needed Orally Three times a day    [provider]  traMADol  (ULTRAM ) 50 MG tablet Take 1 tablet (50 mg total) by mouth every 6 (six) hours as needed for up to 7 days. 05/20/24 05/27/24  Tobie Eldora NOVAK, MD    Allergies: Cephalosporins, Erythromycin, Famotidine, Nabumetone, Penicillins, and Wound dressing adhesive    Review of Systems  HENT:  Positive for nosebleeds.     Updated Vital Signs BP 125/78   Pulse 66   Temp 98 F (36.7 C) (Oral)   Resp 14   SpO2 100%   Physical Exam Vitals and nursing note reviewed.  Constitutional:      General: She is not in acute distress.    Appearance: Normal appearance. She is well-developed. She is not ill-appearing.  HENT:     Head: Normocephalic and atraumatic.     Right Ear: External ear normal.     Left Ear: External ear normal.     Nose:     Comments: Bilateral nasal splints noted, right nare appears to be mildly oozing, left nare is hemostatic on exam.  No septal hematoma noted on exam. Eyes:     Conjunctiva/sclera: Conjunctivae normal.  Cardiovascular:     Rate and Rhythm: Normal rate and regular rhythm.     Pulses: Normal pulses.     Heart sounds: Normal heart sounds. No murmur heard. Pulmonary:     Effort: Pulmonary  effort is normal. No respiratory distress.     Breath sounds: Normal breath sounds.  Abdominal:     Palpations: Abdomen is soft.     Tenderness: There is no abdominal tenderness.  Musculoskeletal:        General: No swelling.     Cervical back: Neck supple.  Skin:    General: Skin is warm and dry.     Capillary Refill: Capillary refill takes less than 2 seconds.  Neurological:     General: No focal deficit present.     Mental Status: She is alert and oriented to person, place, and time.  Psychiatric:        Mood and Affect: Mood normal.     (all labs ordered are listed, but only abnormal results are displayed) Labs Reviewed  CBC - Abnormal; Notable for the following components:       Result Value   WBC 14.4 (*)    All other components within normal limits  BASIC METABOLIC PANEL WITH GFR - Abnormal; Notable for the following components:   CO2 21 (*)    Glucose, Bld 123 (*)    All other components within normal limits    EKG: None  Radiology: No results found.   Procedures   Medications Ordered in the ED - No data to display                                  Medical Decision Making  This patient presents to the ED for concern of epistaxis post septoplasty differential diagnosis includes septal hematoma, postop bleeding, postop hemorrhage    Additional history obtained   Additional history obtained from Electronic Medical Record External records from outside source obtained and reviewed including ENT note   Lab Tests:  I Ordered, and personally interpreted labs.  The pertinent results include:  leukocytosis at 14.4 likely reactive from recent surgery, hemoglobin stable at 14.9, mildly decreased CO2 at 21.  Problem List / ED Course:  Consulted ENT, Dr. Roark who stated to tape 4 x 4's under her nose and let the patient know that if she went through more than 4 of these in 24 hours to call their on-call number. Considered for admission or further workup however patient's vital signs, physical exam, and labs are reassuring.  Patient given 4 x 4's and advised that if she went through more than 4 of these in the next 24 hours to call the ENT on-call number.  Patient given return precautions.  I feel patient safe for discharge at this time.     Final diagnoses:  Bleeding from the nose    ED Discharge Orders     None          Francis Ileana LOISE DEVONNA 05/20/24 2229    Ruthe Cornet, DO 05/20/24 2237

## 2024-05-20 NOTE — Anesthesia Postprocedure Evaluation (Signed)
 Anesthesia Post Note  Patient: Nancy [name]  Procedure(s) Performed: SEPTOPLASTY, BILATERAL INFERIOR TURBINATE REDUCTION, LEFT MAXILLARY ANTROSTOMY WITH TISSUE REMOVAL USING STEALTH NAVIGATION (Nose)     Patient location during evaluation: PACU Anesthesia Type: General Level of consciousness: awake and alert Pain management: pain level controlled Vital Signs Assessment: post-procedure vital signs reviewed and stable Respiratory status: spontaneous breathing, nonlabored ventilation, respiratory function stable and patient connected to nasal cannula oxygen Cardiovascular status: blood pressure returned to baseline and stable Postop Assessment: no apparent nausea or vomiting Anesthetic complications: no   No notable events documented.  Last Vitals:  Vitals:   05/20/24 1215 05/20/24 1238  BP: 134/70 130/73  Pulse: 68 73  Resp: 12 16  Temp:  36.4 C  SpO2: 90% 96%    Last Pain:  Vitals:   05/20/24 1238  TempSrc: Temporal  PainSc: 4                  [name]

## 2024-05-20 NOTE — Discharge Instructions (Addendum)
 Surgery Discharge Instructions (Dr. Tobie)  1. Call your doctor or go to the emergency room if you have: - Fever of 101.5 degrees or higher - Severe pain that has increased greatly since your surgery or is uncontrolled by your current pain medications - Increasing or concerning amount of bleeding from your nose. You should expect some bloody drainage from your nose for 1-3 days. Even a 5-10 minute trickle nose bleed is expected after surgery - Nausea and vomiting that does not go away - Chest pain/shortness of breath - Any other acute events, problems, or concerns  2. Wound Care/Dressings/Drain Instructions:  - It is OK to shower following your surgery. - You have a small amount of dissolvable packing in your nose. The remainder of this may be removed in clinic, otherwise this does not need to come out. - You should begin using nasal saline s[rau once you no longer have bleeding from the nose. This is usually 1-2 days after your surgery. Do not use your other sprays -There are small soft plastic splints in your nose to hold your septum in the middle. These may crust a bit, which can be reduced by using nasal saline spray   3. Follow Up:  - A follow up appointment will be scheduled for you with Dr. Tobie. If you do not know the date/time, please contact our office  4. Activity/Restrictions: - Resume your regular activities, as tolerated - Avoid heavy lifting, bending over, manipulating your nose, blowing your nose, sneezing with your mouth closed, straining and strenuous activities until instructed otherwise. Do not lift more than 10 lbs for 10 days  5. Diet: - Resume your regular diet, as tolerated  6. Additional Instructions: - You should take Prednisone  10mg  daily starting day after surgery and doxycycline  100mg  tablet twice daily for 10 days. - Use acetaminophen /Tylenol  and ibuprofen  to control your pain. If this does not bring you relief or the pain remains severe, a stronger pain  medication (Tramadol  - 50mg  tablet every 6 hours as needed) has been prescribed to you. - DO NOT MIX NARCOTIC PAIN MEDICATIONS OR TAKE NARCOTIC PRESCRIPTIONS AT THE SAME TIME (PERCODET, LORTAB, ROXICODONE , ETC.) - DO NOT DRIVE OR OPERATE HEAVY MACHINERY WHILE ON NARCOTICS - DO NOT TAKE MORE THAN 4 GRAMS (4000mg ) OF TYLENOL  (ACETAMINOPHEN ) IN 24 HOURS  ________________________________________________________________  ENT Office Contact Info: Otolaryngology Nursing Triage (Monday-Friday daytime working hours or for emergencies after hours) [phone]  No tylenol  until 6:30 p.m.  Post Anesthesia Home Care Instructions  Activity: Get plenty of rest for the remainder of the day. A responsible individual must stay with you for 24 hours following the procedure.  For the next 24 hours, DO NOT: -Drive a car -Advertising copywriter -Drink alcoholic beverages -Take any medication unless instructed by your physician -Make any legal decisions or sign important papers.  Meals: Start with liquid foods such as gelatin or soup. Progress to regular foods as tolerated. Avoid greasy, spicy, heavy foods. If nausea and/or vomiting occur, drink only clear liquids until the nausea and/or vomiting subsides. Call your physician if vomiting continues.  Special Instructions/Symptoms: Your throat may feel dry or sore from the anesthesia or the breathing tube placed in your throat during surgery. If this causes discomfort, gargle with warm salt water. The discomfort should disappear within 24 hours.  If you had a scopolamine patch placed behind your ear for the management of post- operative nausea and/or vomiting:  1. The medication in the patch is effective for 72 hours,  after which it should be removed.  Wrap patch in a tissue and discard in the trash. Wash hands thoroughly with soap and water. 2. You may remove the patch earlier than 72 hours if you experience unpleasant side effects which may include dry mouth,  dizziness or visual disturbances. 3. Avoid touching the patch. Wash your hands with soap and water after contact with the patch.

## 2024-05-20 NOTE — Op Note (Signed)
 Otolaryngology Operative note  Nancy [name] Date/Time of Admission: 05/20/2024  7:26 AM  CSN: [id number];MRN:[id number]  DOB: 17-May-1976 Age: 48 y.o. 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 Left chronic maxillary sinusitis Left maxillary sinus foreign body (Tooth)  Post-Op Diagnosis: Same  Procedure: Endoscopic Assisted Septoplasty (CPT [phone]) Bilateral nasal endoscopy with submucous reduction of bilateral inferior turbinates (CPT 30140-50) Left Nasal Endoscopy with Maxillary Antrostomy with Removal of Tissue (CPT [phone]) Stereotactic Computer-Assisted Image Guidance [phone])  Surgeon: Eldora Blanch, MD  Anesthesia type:  General  Anesthesiologist: Anesthesiologist: Keneth Lynwood POUR, MD CRNA: Frost Kayla MATSU, CRNA; Debarah Chiquita LABOR, CRNA   Staff: Circulator: Elaine Avelina PARAS, RN Scrub Person: Alto Charmaine PARAS  Implants: * No implants in log *  Specimens: Left sinus contents - permanent Left maxillary dental fragment - permanent  EBL:  50 mL  Drains: None  Post-op disposition and condition: PACU, hemodynamically stable  Findings:  Complications: None apparent  Indications and consent:  Nancy [name] is a 48 y.o. female with diagnoses above with symptoms persisting despite medical management. The patient's options were discussed, including risks/benefits/alternatives for each option. Patient expressed understanding, and despite these risks, consented and decided to proceed with above procedures. Informed consent was signed before proceeding.  Findings: Left nasal septal deviation, bilateral inferior turbinate hypertrophy Left maxillary sinus with purulence, polypoid mucosa, mucosal retention cyst (on the floor of sinus) and retained left maxillary tooth fragment. Tooth fragment removed, mucosal retention cyst opened. Sinus patent  at end of procedure  Procedure: Anesthesia and Prep -- After being properly identified in the preoperative holding area, the patient was brought into the operating suite. She was placed supine on the operating table. A pre-procedural time-out was performed. Pre-operative antibiotics and steroids were administered. After induction of general anesthesia, the patient was successfully intubated with confirmation of tube placement via CO2 return. Eyes were taped closed. Pledgets soaked in 1:1000 fluorescein  dyed epinephrine  were placed in each nostril. The face was prepped and draped in usual fashion for sinus surgery.  Image Guidance Registration and Prep -- Image guidance was necessary due to disease butting lamina papyracea and to facilitate exact location of foreign body.  The registration sticker for the Stereotactic image guidance system was placed on the forehead. The face was registered with good correlation. Landmarks were checked with the probe which showed satisfactory accuracy.   Endoscopic-assisted septoplasty  -- The nose was decongested with epinephrine  1:1000 soaked pledgets. The septum was injected bilaterally with 1% lidocaine  with 1:100,000 epinephrine . A 15 blade was utilized to make a hemi-transfixion incision in the left nasal cavity. A Cottle elevator was then used to elevate a sub-mucoperichondrial flap. Once adequate room was available, the 0 degree endoscopic was placed into the flap and the septal cartilage was visualized. The suction freer was used to raise the flap back to the bony-cartilaginous junction. A caudal transcartilaginous cut was made using the Cottle elevator taking care to leave at least a 1cm caudal strut. The opposing mucoperichondrial flap was then elevated off the cartilage and bone of the right side. Then, a Rudean forcep was used to cut away the cartilage working anterior to posterior. As the cartilage was removed superiorly, care was taken to leave at least a 1cm  dorsal strut.  A Jansen-Middleton double-action rongeur was used to remove the bony aspect of the deviated septum.  During elevation, there was a small tear in the  left mucoperichondrial flap overlying the previous leftward spur.  There was no opposing defect created.  Bony maxillary crest was identified and partially removed with a Rudean forcep. The nose was then inspected and the septum was seen to be midline. The previous nasal obstruction was much improved. At the conclusion of the case, the hemitransfixion incision was closed with interrupted 4-0 vicryl rapide suture and doyle splints coated in mupirocin  were placed bilaterally and sutured to the anterior septum with a 3-0 prolene suture.   Bilateral submucous inferior turbinate reduction with microdebrider and outfracture -- 1% lidocaine  with 1:100,000 units of epinephrine  was injected into the bilateral inferior turbinates. A 15 blade was used to make an incision in the head of the left inferior turbinate. A cottle elevator was then used to elevate a submucosal flap along the medial surface of the turbinate. A microdebrider turbinate blade was inserted into the left turbinate and pieces of bone and soft tissue of the turbinate were resected in a submucosal fashion. Particular attention was paid to the head of the turbinate to improve nasal valve airflow. Once adequate resection was achieved, a Engineering geologist was used to out-fracture the inferior turbinate. This procedure was then repeated on the right side to complete the bilateral submucosal inferior turbinate resection. The nasal airway was then seen to be widely patent.  We then began with maxillary antrostomy.  Left nasal endoscopy with maxillary antrostomy with removal of tissue -- 1% Lidocaine  with 1:100,000 epinephrine  was used to infiltrate the axilla of the middle turbinate, head of the middle turbinate and the sphenopalatine artery on left. The middle turbinate was medialized using a  freer. The maxillary seeker was inserted and slipped behind the uncinate process. This was pulled anteriorly exposing the edge. A pediatric back-biter was inserted and the uncinate was cut anteriorly toward but not into the nasolacrimal duct. The microdebrider was used to remove the uncinate process above and below the cut made by the back-biter. A curved suction was inserted into the maxillary sinus through the antrostomy/natural os and pushed posteriorly and inferiorly to enlarge the opening. The microdebrider was used to clean up the edges of bone and mucosa. The 45 degree endoscope was then attached and used to view the sinus. Posterior cuts were finished with a straight thru cut forcep and antrostomy was enlarged inferiorly to provide access using a down biting punch. The backbiter was then used to finish anterior cuts up to nasolacrimal duct. The natural os was identified and the maxillary antrostomy was complete. The sinus was filled with purulence, polypoid mucosa and there were mucosal retention cysts (on the floor of sinus). These were first suctioned and removed using the curved suction and frontal giraffe instruments. The retained left maxillary tooth fragment was located using image guidance and identified visually. It was freed from the surrounding scar using a curved suction and the frontal giraffe was used to remove the fragment and passed off as specimen. The sinus was copiously irrigated with saline until clear.  Conclusion -- Both sides were inspected and no orbital fat or evidence of cerebral spinal fluid leak were observed. Both sides were irrigated and clot removed. A PosiSepX pack was inserted lateral to the middle turbinate on left. A flexible suction catheter was used to remove fluid and blood from the oropharynx and hypopharynx.   Tape was removed from the eyelids and the eyes were checked for tension or proptosis, none was observed. The image guidance sticker was removed. The patient's  skin was  cleaned. She was returned to the care of the anesthesia team. She was then weaned from the anesthetic and transported to the PACU in stable condition.  Abelardo Seidner B Arlita Buffkin

## 2024-05-20 NOTE — ED Triage Notes (Signed)
 Patient reports nasal bleeding at both nostrils this afternoon , surgery done today (Septoplasty ) at surgery center .

## 2024-05-20 NOTE — Anesthesia Preprocedure Evaluation (Addendum)
 Anesthesia Evaluation  Patient identified by MRN, date of birth, ID band Patient awake    Reviewed: Allergy & Precautions, NPO status , Patient's Chart, lab work & pertinent test results, reviewed documented beta blocker date and time   History of Anesthesia Complications (+) PROLONGED EMERGENCE and history of anesthetic complications  Airway Mallampati: II  TM Distance: >3 FB     Dental no notable dental hx.    Pulmonary neg shortness of breath, sleep apnea , neg COPD, former smoker   breath sounds clear to auscultation       Cardiovascular (-) angina (-) CAD and (-) Past MI + dysrhythmias + Valvular Problems/Murmurs  Rhythm:Regular Rate:Normal     Neuro/Psych  Headaches, neg Seizures PSYCHIATRIC DISORDERS Anxiety Depression     Neuromuscular disease    GI/Hepatic ,neg GERD  ,,(+) neg Cirrhosis        Endo/Other    Renal/GU Renal disease     Musculoskeletal  (+) Arthritis , Rheumatoid disorders,    Abdominal   Peds  Hematology   Anesthesia Other Findings   Reproductive/Obstetrics                              Anesthesia Physical Anesthesia Plan  ASA: 2  Anesthesia Plan: General   Post-op Pain Management:    Induction: Intravenous  PONV Risk Score and Plan: 2 and Ondansetron  and Dexamethasone   Airway Management Planned: Oral ETT  Additional Equipment:   Intra-op Plan:   Post-operative Plan: Extubation in OR  Informed Consent: I have reviewed the patients History and Physical, chart, labs and discussed the procedure including the risks, benefits and alternatives for the proposed anesthesia with the patient or authorized representative who has indicated his/her understanding and acceptance.     Dental advisory given  Plan Discussed with: CRNA  Anesthesia Plan Comments:          Anesthesia Quick Evaluation

## 2024-05-20 NOTE — Anesthesia Procedure Notes (Signed)
 Procedure Name: Intubation Date/Time: 05/20/2024 9:38 AM  Performed by: Debarah Chiquita LABOR, CRNAPre-anesthesia Checklist: Patient identified, Emergency Drugs available, Suction available and Patient being monitored Patient Re-evaluated:Patient Re-evaluated prior to induction Oxygen Delivery Method: Circle system utilized Preoxygenation: Pre-oxygenation with 100% oxygen Induction Type: IV induction Ventilation: Mask ventilation without difficulty Laryngoscope Size: Mac and 3 Grade View: Grade I Tube type: Oral Rae Tube size: 7.0 mm Number of attempts: 1 Airway Equipment and Method: Stylet and Bite block Placement Confirmation: ETT inserted through vocal cords under direct vision, positive ETCO2 and breath sounds checked- equal and bilateral Tube secured with: Tape Dental Injury: Teeth and Oropharynx as per pre-operative assessment

## 2024-05-20 NOTE — H&P (Signed)
 Pre-Operative H&P - Day Of Surgery Patient Name: Nancy [name] Date:   05/20/2024  HPI: Nancy [name] is a 48 y.o. female who presents today for operative treatment of nasal congestion, nasal obstruction, nasal septal deviation, bilateral inferior turbinate hypertrophy and left maxillary sinusitis. Patient denies recent significant changes to health or significant new medications or physiologic change in condition which would immediately impact plans. No new types of therapy has been initiated that would change the plan or the appropriateness of the plan.   ROS:  A complete review of systems was obtained and is otherwise negative.   PMH:  Past Medical History:  Diagnosis Date   Abnormal EKG    Anxiety    Complication of anesthesia    takes a long time to wake up   DDD (degenerative disc disease), lumbar    Depression    Heart murmur IDK   LBBB (left bundle branch block)    Lumbago with sciatica    Paresthesia of lower lip    Rheumatoid arthritis (HCC)    Scleritis    Sleep apnea    Vitamin D deficiency     PSH:  Past Surgical History:  Procedure Laterality Date   CESAREAN SECTION  1995   COLONOSCOPY N/A 05/07/2024   Procedure: COLONOSCOPY;  Surgeon: Cindie Carlin POUR, DO;  Location: [facility];  Service: Endoscopy;  Laterality: N/A;  200pm, asa 2   LAMINECTOMY     2019   LAPAROSCOPIC HYSTERECTOMY     2010   NISSEN FUNDOPLICATION  2005   SPINAL CORD STIMULATOR INSERTION     2021    MEDS:   Current Facility-Administered Medications:    lactated ringers  infusion, , Intravenous, Continuous, Cleotilde Butler Dade, MD, Last Rate: 10 mL/hr at 05/20/24 0835, Continued from Pre-op at 05/20/24 0835  ALLERGIES: Cephalosporins, Erythromycin, Famotidine, Nabumetone, Penicillins, and Wound dressing adhesive  EXAM: Vitals: BP (!) 124/59   Pulse 70   Temp 98.2 F (36.8 C) (Temporal)   Resp 16   Ht 4' 11.49 (1.511 m)   Wt 71.1 kg   SpO2 99%   BMI 31.14 kg/m   General  Awake, at baseline alertness.   HEENT No scleral icterus or conjunctival hemorrhage. Globe position appears normal. External ears  normal. Nose patent without rhinorrhea. No lymphadenopathy. No thyromegaly  Cardiovascular No cyanosis.  Pulmonary No audible stridor. Breathing easily with no labor.  Neuro Symmetric facial movement.   Psychiatry Appropriate affect and mood.  Skin No scars or lesions on face or neck.  Extermities Moves all extremities with normal range of motion.   Other Findings None.   Assessment & Plan: Nancy [name] has diagnoses of nasal congestion, nasal obstruction, nasal septal deviation, bilateral inferior turbinate hypertrophy and left maxillary sinusitis and will go to the OR today for septoplasty, left maxillary antrostomy, bilateral inferior turbinate reduction, possible caldwell luc approach. Informed consent was obtained and available in EMR today. All questions have been answered, and risks/benefits/alternatives of procedure as noted in the consent were discussed in a quiet area. Questions were invited and answered. The patient expressed understanding, provided consent and wished to proceed despite risks.  Nancy [name] 05/20/2024 8:50 AM

## 2024-05-21 ENCOUNTER — Ambulatory Visit (INDEPENDENT_AMBULATORY_CARE_PROVIDER_SITE_OTHER): Admitting: Physician Assistant

## 2024-05-21 ENCOUNTER — Ambulatory Visit: Payer: Self-pay | Admitting: Internal Medicine

## 2024-05-21 ENCOUNTER — Encounter (HOSPITAL_BASED_OUTPATIENT_CLINIC_OR_DEPARTMENT_OTHER): Payer: Self-pay | Admitting: Otolaryngology

## 2024-05-21 VITALS — BP 123/79 | HR 67

## 2024-05-21 DIAGNOSIS — J342 Deviated nasal septum: Secondary | ICD-10-CM

## 2024-05-21 DIAGNOSIS — Z9889 Other specified postprocedural states: Secondary | ICD-10-CM

## 2024-05-21 LAB — SURGICAL PATHOLOGY

## 2024-05-21 MED ORDER — OXYCODONE HCL 5 MG PO TABS: 5.0000 mg | ORAL_TABLET | ORAL | 0 refills | Status: DC | PRN

## 2024-05-21 NOTE — Progress Notes (Signed)
 Dear Dr. Oris, Here is my assessment for our mutual patient, Nancy [name]. Thank you for allowing me the opportunity to care for your patient. Please do not hesitate to contact me should you have any other questions. Sincerely, Nancy Cohen PA-C  Otolaryngology Clinic Note Referring provider: Dr. Oris HPI:  Nancy [name] is a 48 y.o. female kindly referred by Dr. Oris   The patient is a 48 year old female seen postop day 1 status post endoscopic assisted septoplasty with submucosal reduction of bilateral inferior turbinates with left maxillary antrostomy with removal of tissue by Dr. Tobie.  Postoperatively the patient notes she had intermittent bleeding from the bilateral naris, she notes the left did improve and has stopped bleeding but the right has persisted.  She was seen in the emergency room last night for ongoing bleeding.  She notes this persisted through the night, she has had to change the gauze frequently, she notes this is not a large volume but is persistent.  She notes ongoing discomfort in the nose and sinuses.    Independent Review of Additional Tests or Records:  Operative note from 05/20/2024   PMH/Meds/All/SocHx/FamHx/ROS:   Past Medical History:  Diagnosis Date   Abnormal EKG    Anxiety    Complication of anesthesia    takes a long time to wake up   DDD (degenerative disc disease), lumbar    Depression    Heart murmur IDK   LBBB (left bundle branch block)    Lumbago with sciatica    Paresthesia of lower lip    Rheumatoid arthritis (HCC)    Scleritis    Sleep apnea    Vitamin D deficiency      Past Surgical History:  Procedure Laterality Date   CESAREAN SECTION  1995   COLONOSCOPY N/A 05/07/2024   Procedure: COLONOSCOPY;  Surgeon: Cindie Carlin POUR, DO;  Location: [facility];  Service: Endoscopy;  Laterality: N/A;  200pm, asa 2   LAMINECTOMY     2019   LAPAROSCOPIC HYSTERECTOMY     2010   NISSEN FUNDOPLICATION  2005   SINUS ENDO WITH  FUSION N/A 05/20/2024   Procedure: SEPTOPLASTY, BILATERAL INFERIOR TURBINATE REDUCTION, LEFT MAXILLARY ANTROSTOMY WITH TISSUE REMOVAL USING STEALTH NAVIGATION;  Surgeon: Tobie Eldora NOVAK, MD;  Location: [facility];  Service: ENT;  Laterality: N/A;   SPINAL CORD STIMULATOR INSERTION     2021    Family History  Problem Relation Age of Onset   Rheum arthritis Mother    Coronary artery disease Brother        found at autopsy after committing suicide   Suicidality Brother 49       2013   Rheum arthritis Maternal Grandmother      Social Connections: Not on file      Current Outpatient Medications:    acetaminophen  (TYLENOL ) 500 MG tablet, Take 2 tablets (1,000 mg total) by mouth every 6 (six) hours as needed., Disp: 100 tablet, Rfl: 2   Cholecalciferol (VITAMIN D) 125 MCG (5000 UT) CAPS, Take 1 capsule by mouth 3 (three) times a week., Disp: , Rfl:    Continuous Glucose Sensor (FREESTYLE LIBRE 3 SENSOR) MISC, Place 1 sensor on the skin every 14 days. Use to check glucose continuously (Patient not taking: Reported on 04/11/2024), Disp: 0.66 each, Rfl: 1   doxycycline  (VIBRA -TABS) 100 MG tablet, Take 1 tablet (100 mg total) by mouth 2 (two) times daily for 10 days., Disp: 20 tablet, Rfl: 0   ibuprofen  (MOTRIN   IB) 200 MG tablet, Take 2 tablets (400 mg total) by mouth every 6 (six) hours as needed., Disp: 100 tablet, Rfl: 2   inFLIXimab-axxq (AVSOLA IV), Inject into the vein., Disp: , Rfl:    levocetirizine (XYZAL ) 5 MG tablet, TAKE 1 TABLET BY MOUTH EVERY DAY IN THE EVENING, Disp: 30 tablet, Rfl: 5   Multiple Vitamin (MULTIVITAMIN ADULT PO), Take by mouth., Disp: , Rfl:    predniSONE  (DELTASONE ) 10 MG tablet, Take 1 tablet (10 mg total) by mouth daily for 10 days., Disp: 10 tablet, Rfl: 0   SUMAtriptan 6 MG/0.5ML SOAJ, Inject into the skin daily., Disp: , Rfl:    tiZANidine (ZANAFLEX) 4 MG tablet, 1 tablet as needed Orally Three times a day, Disp: , Rfl:    traMADol  (ULTRAM ) 50 MG  tablet, Take 1 tablet (50 mg total) by mouth every 6 (six) hours as needed for up to 7 days., Disp: 15 tablet, Rfl: 0   Physical Exam:   BP 123/79   Pulse 67   SpO2 98%   Pertinent Findings  CN II-XII intact Anterior rhinoscopy: Septum midline with splint in place, some minimal red oozing from the right nostril, no obvious source on exam, left nostril with clots no active bleeding; No lesions of oral cavity/oropharynx; no blood in the posterior oropharynx No respiratory distress or stridor  Seprately Identifiable Procedures:  None  Impression & Plans:  Nancy [name] is a 48 y.o. female with the following   Postop visit-  Patient with expected postop epistaxis.  Although it is persistent she has no large volume of bleeding, we would expect some ongoing oozing within the first 24 hours.  I advised the patient to reach out to our office if the symptoms persist or if they worsen.  The patient also was not getting significant pain relief from the tramadol  so I sent in a short course of oxycodone .  She will reach out immediately if she develops any new or worsening signs or symptoms.   - f/u as needed, follow-up appointment on Friday   Thank you for allowing me the opportunity to care for your patient. Please do not hesitate to contact me should you have any other questions.  Sincerely, Nancy Cohen PA-C [facility] Phone: [phone] Fax: [phone]  05/21/2024, 10:18 AM

## 2024-05-24 ENCOUNTER — Encounter (INDEPENDENT_AMBULATORY_CARE_PROVIDER_SITE_OTHER): Payer: Self-pay | Admitting: Otolaryngology

## 2024-05-24 ENCOUNTER — Ambulatory Visit (INDEPENDENT_AMBULATORY_CARE_PROVIDER_SITE_OTHER): Admitting: Otolaryngology

## 2024-05-24 VITALS — BP 114/66 | HR 66 | Ht 59.0 in | Wt 160.0 lb

## 2024-05-24 DIAGNOSIS — J343 Hypertrophy of nasal turbinates: Secondary | ICD-10-CM

## 2024-05-24 DIAGNOSIS — J3489 Other specified disorders of nose and nasal sinuses: Secondary | ICD-10-CM

## 2024-05-24 DIAGNOSIS — T170XXA Foreign body in nasal sinus, initial encounter: Secondary | ICD-10-CM

## 2024-05-24 DIAGNOSIS — J320 Chronic maxillary sinusitis: Secondary | ICD-10-CM

## 2024-05-24 DIAGNOSIS — R0981 Nasal congestion: Secondary | ICD-10-CM

## 2024-05-24 DIAGNOSIS — J342 Deviated nasal septum: Secondary | ICD-10-CM

## 2024-05-24 DIAGNOSIS — Z9889 Other specified postprocedural states: Secondary | ICD-10-CM

## 2024-05-24 NOTE — Progress Notes (Signed)
 S/p left FESS and septo/turbs and removal of dental fragment on 05/20/2024 S: Patient reports no significant bleeding after last visit. Does report expected nasal pain. No fevers. Finishing perioperative abx/steroid. Otherwise no significant issues. Path d/w pt  O: Doyle splints in place, removed; after removal, no septal hematoma noted; Able to visualize axilla of MT bilaterally; tubinates reduced; no epistaxis. No evidence of infection. Nasal endoscopy was indicated to better evaluate the nose and paranasal sinuses, given the patient's history of recent sinus surgery and exam findings, and is detailed below. Prior to proceeding, verbal consent was obtained.   PROCEDURE: Bilateral Diagnostic Rigid Nasal Endoscopy with LEFT Debridement Pre-procedure diagnosis: Post-operative examination and care after Left Functional Endoscopic Sinus Surgery - of note: this procedure was NOT performed for management of the septoplasty or the turbinate reduction. Post-procedure diagnosis: same Indication: See pre-procedure diagnosis and physical exam above Complications: None apparent EBL: 0 mL Anesthesia: Lidocaine  4% and topical decongestant was topically sprayed in each nasal cavity   Description of Procedure:  Patient was identified. A rigid 30 degree endoscope was utilized to evaluate the sinonasal cavities, mucosa, sinus ostia and turbinates and septum.  Overall, signs of mucosal inflammation are noted. Also noted are post-surgical changes with some crusting and clot within left middle meatus with residual posi sep packing. These were debrided with a 8 Fr suction. After debridement, sinus cavity patency was improved. No adverse synechiae are noted today Right Middle meatus: patent Right SE Recess: patent Left MM: more patent Left SE Recess: patent   CPT CODE -- 31237 - Mod 79, 50   A/P: 48 y.o. female with left retained tooth fragment within maxillary sinus with chronic max sinusitis on left as well as  septal deviation and nasal obstruction s/p Left FESS and septoplasty.and ITR After splint removal, patient is doing better with improved breathing Sinus cavities debrided. Resume nasal sprays as pre-op Start nasal saline rinses BID F/u 1 week

## 2024-05-28 NOTE — Progress Notes (Signed)
 Done

## 2024-05-30 ENCOUNTER — Ambulatory Visit (INDEPENDENT_AMBULATORY_CARE_PROVIDER_SITE_OTHER): Admitting: Otolaryngology

## 2024-05-30 ENCOUNTER — Encounter (INDEPENDENT_AMBULATORY_CARE_PROVIDER_SITE_OTHER): Payer: Self-pay | Admitting: Otolaryngology

## 2024-05-30 VITALS — BP 114/77 | HR 78 | Ht 59.0 in | Wt 160.0 lb

## 2024-05-30 DIAGNOSIS — T170XXA Foreign body in nasal sinus, initial encounter: Secondary | ICD-10-CM

## 2024-05-30 DIAGNOSIS — J3489 Other specified disorders of nose and nasal sinuses: Secondary | ICD-10-CM

## 2024-05-30 DIAGNOSIS — J342 Deviated nasal septum: Secondary | ICD-10-CM

## 2024-05-30 DIAGNOSIS — J320 Chronic maxillary sinusitis: Secondary | ICD-10-CM

## 2024-05-30 DIAGNOSIS — J343 Hypertrophy of nasal turbinates: Secondary | ICD-10-CM

## 2024-05-30 DIAGNOSIS — R0981 Nasal congestion: Secondary | ICD-10-CM

## 2024-05-30 DIAGNOSIS — Z9889 Other specified postprocedural states: Secondary | ICD-10-CM

## 2024-05-30 NOTE — Progress Notes (Signed)
 S/p left FESS and septo/turbs and removal of dental fragment on 05/20/2024 S: Patient reports no significant bleeding. Breathing significantly better. Rinsing BID and using flonase.  O: No septal hematoma noted; Able to visualize axilla of MT bilaterally; tubinates reduced; no epistaxis. No evidence of infection. Nasal endoscopy was indicated to better evaluate the nose and paranasal sinuses, given the patient's history of recent sinus surgery and exam findings, and is detailed below. Prior to proceeding, verbal consent was obtained.   PROCEDURE: Bilateral Diagnostic Rigid Nasal Endoscopy with LEFT Debridement Pre-procedure diagnosis: Post-operative examination and care after Left Functional Endoscopic Sinus Surgery - of note: this procedure was NOT performed for management of the septoplasty or the turbinate reduction. Post-procedure diagnosis: same Indication: See pre-procedure diagnosis and physical exam above Complications: None apparent EBL: 0 mL Anesthesia: Lidocaine  4% and topical decongestant was topically sprayed in each nasal cavity   Description of Procedure:  Patient was identified. A rigid 30 degree endoscope was utilized to evaluate the sinonasal cavities, mucosa, sinus ostia and turbinates and septum.  Overall, signs of mucosal inflammation are noted but improving. Also noted are post-surgical changes fair amount of crusting within left middle meatus. These were debrided with a 8 Fr suction. After debridement, sinus cavity patency was improved. No adverse synechiae are noted today Right Middle meatus: patent Right SE Recess: patent Left MM: more patent Left SE Recess: patent   CPT CODE -- 31237 - Mod 79, 50   A/P: 48 y.o. female with left retained tooth fragment within maxillary sinus with chronic max sinusitis on left as well as septal deviation and nasal obstruction s/p Left FESS and septoplasty.and ITR Sinus cavities debrided. Continue nasal sprays as pre-op Continue nasal  saline rinses BID F/u 3 weeks

## 2024-06-20 ENCOUNTER — Ambulatory Visit (INDEPENDENT_AMBULATORY_CARE_PROVIDER_SITE_OTHER): Admitting: Otolaryngology

## 2024-06-20 ENCOUNTER — Encounter (INDEPENDENT_AMBULATORY_CARE_PROVIDER_SITE_OTHER): Payer: Self-pay | Admitting: Otolaryngology

## 2024-06-20 VITALS — BP 123/80 | HR 75 | Ht 59.0 in | Wt 160.0 lb

## 2024-06-20 DIAGNOSIS — R0981 Nasal congestion: Secondary | ICD-10-CM

## 2024-06-20 DIAGNOSIS — J320 Chronic maxillary sinusitis: Secondary | ICD-10-CM

## 2024-06-20 DIAGNOSIS — T170XXA Foreign body in nasal sinus, initial encounter: Secondary | ICD-10-CM

## 2024-06-20 DIAGNOSIS — J3489 Other specified disorders of nose and nasal sinuses: Secondary | ICD-10-CM

## 2024-06-20 DIAGNOSIS — J342 Deviated nasal septum: Secondary | ICD-10-CM

## 2024-06-20 DIAGNOSIS — Z9889 Other specified postprocedural states: Secondary | ICD-10-CM

## 2024-06-20 DIAGNOSIS — J343 Hypertrophy of nasal turbinates: Secondary | ICD-10-CM

## 2024-06-20 NOTE — Progress Notes (Signed)
 S/p left FESS and septo/turbs and removal of dental fragment on 05/20/2024 S: Patient reports no significant bleeding. Breathing significantly better. Rinsing daily and using flonase. She is overall happy with how she is doing, slight yellow drinage from left  O: Septum midline, Able to visualize axilla of MT bilaterally; tubinates reduced; no epistaxis. No evidence of infection. Nasal endoscopy was indicated to better evaluate the nose and paranasal sinuses, given the patient's history of recent sinus surgery and exam findings, and is detailed below. Prior to proceeding, verbal consent was obtained.   PROCEDURE: Bilateral Diagnostic Rigid Nasal Endoscopy with LEFT Debridement Pre-procedure diagnosis: Post-operative examination and care after Left Functional Endoscopic Sinus Surgery - of note: this procedure was NOT performed for management of the septoplasty or the turbinate reduction. Post-procedure diagnosis: same Indication: See pre-procedure diagnosis and physical exam above Complications: None apparent EBL: 0 mL Anesthesia: Lidocaine  4% and topical decongestant was topically sprayed in each nasal cavity   Description of Procedure:  Patient was identified. A rigid 30 degree endoscope was utilized to evaluate the sinonasal cavities, mucosa, sinus ostia and turbinates and septum.  Overall, signs of mucosal inflammation are noted but improving. Also noted are post-surgical changes fair amount of crusting with thick mucoid secretion within left middle meatus. These were debrided with a 8 Fr suction. After debridement, sinus cavity patency was improved. No adverse synechiae are noted today Right Middle meatus: patent Right SE Recess: patent Left MM: more patent Left SE Recess: patent   CPT CODE -- 31237 - Mod 79   A/P: 48 y.o. female with left retained tooth fragment within maxillary sinus with chronic max sinusitis on left as well as septal deviation and nasal obstruction s/p Left FESS and  septoplasty.and ITR Sinus cavities debrided. Continue nasal sprays as pre-op Continue nasal saline rinses daily F/u 6-8 weeks given still fair amount of crusting

## 2024-07-05 ENCOUNTER — Encounter: Payer: Self-pay | Admitting: Internal Medicine

## 2024-07-15 ENCOUNTER — Encounter (HOSPITAL_BASED_OUTPATIENT_CLINIC_OR_DEPARTMENT_OTHER): Payer: Self-pay | Admitting: Surgery

## 2024-07-15 ENCOUNTER — Other Ambulatory Visit: Payer: Self-pay

## 2024-07-18 ENCOUNTER — Ambulatory Visit: Payer: Self-pay | Admitting: Surgery

## 2024-07-18 NOTE — Anesthesia Preprocedure Evaluation (Addendum)
 Anesthesia Evaluation  Patient identified by MRN, date of birth, ID band Patient awake    Reviewed: Allergy & Precautions, NPO status , Patient's Chart, lab work & pertinent test results, reviewed documented beta blocker date and time   History of Anesthesia Complications (+) PROLONGED EMERGENCE and history of anesthetic complications  Airway Mallampati: II  TM Distance: >3 FB     Dental no notable dental hx.    Pulmonary neg shortness of breath, sleep apnea , neg COPD, former smoker   Pulmonary exam normal        Cardiovascular (-) angina (-) CAD and (-) Past MI Normal cardiovascular exam+ dysrhythmias + Valvular Problems/Murmurs  Rhythm:Regular Rate:Normal     Neuro/Psych  Headaches, neg Seizures PSYCHIATRIC DISORDERS Anxiety Depression     Neuromuscular disease    GI/Hepatic ,neg GERD  ,,(+) neg Cirrhosis        Endo/Other    Renal/GU Renal disease     Musculoskeletal  (+) Arthritis , Rheumatoid disorders,    Abdominal   Peds  Hematology   Anesthesia Other Findings Joo:ezerpi, erythromycin, cephlosporins, pcns  Reproductive/Obstetrics                              Anesthesia Physical Anesthesia Plan  ASA: 2  Anesthesia Plan: MAC   Post-op Pain Management: Precedex and Ofirmev  IV (intra-op)*   Induction: Intravenous  PONV Risk Score and Plan: 2 and Ondansetron , Treatment may vary due to age or medical condition, Propofol  infusion and TIVA  Airway Management Planned: Nasal Cannula and Natural Airway  Additional Equipment: None  Intra-op Plan:   Post-operative Plan:   Informed Consent: I have reviewed the patients History and Physical, chart, labs and discussed the procedure including the risks, benefits and alternatives for the proposed anesthesia with the patient or authorized representative who has indicated his/her understanding and acceptance.     Dental advisory  given  Plan Discussed with: CRNA and Surgeon  Anesthesia Plan Comments:          Anesthesia Quick Evaluation

## 2024-07-19 ENCOUNTER — Other Ambulatory Visit: Payer: Self-pay

## 2024-07-19 ENCOUNTER — Encounter (HOSPITAL_BASED_OUTPATIENT_CLINIC_OR_DEPARTMENT_OTHER): Payer: Self-pay | Admitting: Anesthesiology

## 2024-07-19 ENCOUNTER — Encounter (HOSPITAL_BASED_OUTPATIENT_CLINIC_OR_DEPARTMENT_OTHER): Payer: Self-pay | Admitting: Surgery

## 2024-07-19 ENCOUNTER — Ambulatory Visit (HOSPITAL_BASED_OUTPATIENT_CLINIC_OR_DEPARTMENT_OTHER): Payer: Self-pay | Admitting: Anesthesiology

## 2024-07-19 ENCOUNTER — Encounter (HOSPITAL_BASED_OUTPATIENT_CLINIC_OR_DEPARTMENT_OTHER)
Admission: RE | Disposition: A | Discharge status: Home / Self Care | Payer: Self-pay | Source: Home / Self Care | Attending: Surgery

## 2024-07-19 ENCOUNTER — Ambulatory Visit (HOSPITAL_BASED_OUTPATIENT_CLINIC_OR_DEPARTMENT_OTHER)
Admission: RE | Admit: 2024-07-19 | Discharge: 2024-07-19 | Disposition: A | Discharge status: Home / Self Care | Attending: Surgery | Admitting: Surgery

## 2024-07-19 DIAGNOSIS — K601 Chronic anal fissure: Secondary | ICD-10-CM | POA: Insufficient documentation

## 2024-07-19 DIAGNOSIS — Z79899 Other long term (current) drug therapy: Secondary | ICD-10-CM | POA: Diagnosis not present

## 2024-07-19 DIAGNOSIS — I447 Left bundle-branch block, unspecified: Secondary | ICD-10-CM | POA: Diagnosis not present

## 2024-07-19 DIAGNOSIS — K648 Other hemorrhoids: Secondary | ICD-10-CM | POA: Insufficient documentation

## 2024-07-19 DIAGNOSIS — K629 Disease of anus and rectum, unspecified: Secondary | ICD-10-CM | POA: Insufficient documentation

## 2024-07-19 DIAGNOSIS — N289 Disorder of kidney and ureter, unspecified: Secondary | ICD-10-CM | POA: Diagnosis not present

## 2024-07-19 DIAGNOSIS — G709 Myoneural disorder, unspecified: Secondary | ICD-10-CM | POA: Insufficient documentation

## 2024-07-19 DIAGNOSIS — M069 Rheumatoid arthritis, unspecified: Secondary | ICD-10-CM | POA: Insufficient documentation

## 2024-07-19 DIAGNOSIS — Z01818 Encounter for other preprocedural examination: Secondary | ICD-10-CM

## 2024-07-19 DIAGNOSIS — G4730 Sleep apnea, unspecified: Secondary | ICD-10-CM | POA: Insufficient documentation

## 2024-07-19 DIAGNOSIS — K644 Residual hemorrhoidal skin tags: Secondary | ICD-10-CM | POA: Diagnosis not present

## 2024-07-19 HISTORY — PX: RECTAL EXAM UNDER ANESTHESIA: SHX6399

## 2024-07-19 HISTORY — PX: SPHINCTEROTOMY: SHX5279

## 2024-07-19 SURGERY — EXAM UNDER ANESTHESIA, RECTUM
Anesthesia: Monitor Anesthesia Care | Site: Rectum

## 2024-07-19 MED ORDER — ONABOTULINUMTOXINA 100 UNITS IJ SOLR
INTRAMUSCULAR | Status: DC | PRN
  Administered 2024-07-19: 100 [IU] via INTRAMUSCULAR

## 2024-07-19 MED ORDER — SODIUM CHLORIDE (PF) 0.9 % IJ SOLN
INTRAMUSCULAR | Status: DC | PRN
  Administered 2024-07-19: 2 mL

## 2024-07-19 MED ORDER — ONDANSETRON HCL 4 MG/2ML IJ SOLN
INTRAMUSCULAR | Status: AC
  Filled 2024-07-19: qty 2

## 2024-07-19 MED ORDER — PROPOFOL 500 MG/50ML IV EMUL
INTRAVENOUS | Status: DC | PRN
  Administered 2024-07-19: 150 ug/kg/min via INTRAVENOUS

## 2024-07-19 MED ORDER — LACTATED RINGERS IV SOLN: INTRAVENOUS | Status: DC

## 2024-07-19 MED ORDER — MIDAZOLAM HCL 5 MG/5ML IJ SOLN
INTRAMUSCULAR | Status: DC | PRN
  Administered 2024-07-19: 2 mg via INTRAVENOUS

## 2024-07-19 MED ORDER — FENTANYL CITRATE (PF) 100 MCG/2ML IJ SOLN
INTRAMUSCULAR | Status: DC | PRN
  Administered 2024-07-19: 50 ug via INTRAVENOUS

## 2024-07-19 MED ORDER — MIDAZOLAM HCL 2 MG/2ML IJ SOLN
INTRAMUSCULAR | Status: AC
  Filled 2024-07-19: qty 2

## 2024-07-19 MED ORDER — OXYCODONE HCL 5 MG PO TABS: 5.0000 mg | ORAL_TABLET | Freq: Once | ORAL | Status: DC | PRN

## 2024-07-19 MED ORDER — ACETAMINOPHEN 500 MG PO TABS
ORAL_TABLET | ORAL | Status: AC
  Filled 2024-07-19: qty 2

## 2024-07-19 MED ORDER — ROCURONIUM BROMIDE 10 MG/ML (PF) SYRINGE
PREFILLED_SYRINGE | INTRAVENOUS | Status: AC
  Filled 2024-07-19: qty 10

## 2024-07-19 MED ORDER — ONDANSETRON HCL 4 MG/2ML IJ SOLN
INTRAMUSCULAR | Status: DC | PRN
  Administered 2024-07-19: 4 mg via INTRAVENOUS

## 2024-07-19 MED ORDER — ONDANSETRON HCL 4 MG/2ML IJ SOLN: 4.0000 mg | Freq: Once | INTRAMUSCULAR | Status: DC | PRN

## 2024-07-19 MED ORDER — HYDROMORPHONE HCL 1 MG/ML IJ SOLN: 0.2500 mg | INTRAMUSCULAR | Status: DC | PRN

## 2024-07-19 MED ORDER — BUPIVACAINE-EPINEPHRINE 0.5% -1:200000 IJ SOLN
INTRAMUSCULAR | Status: DC | PRN
  Administered 2024-07-19: 30 mL

## 2024-07-19 MED ORDER — CHLORHEXIDINE GLUCONATE CLOTH 2 % EX PADS: 6.0000 | MEDICATED_PAD | Freq: Once | CUTANEOUS | Status: DC

## 2024-07-19 MED ORDER — PROPOFOL 10 MG/ML IV BOLUS
INTRAVENOUS | Status: DC | PRN
  Administered 2024-07-19 (×3): 20 mg via INTRAVENOUS
  Administered 2024-07-19: 40 mg via INTRAVENOUS

## 2024-07-19 MED ORDER — ACETAMINOPHEN 500 MG PO TABS
1000.0000 mg | ORAL_TABLET | ORAL | Status: AC
  Administered 2024-07-19: 1000 mg via ORAL

## 2024-07-19 MED ORDER — FENTANYL CITRATE (PF) 100 MCG/2ML IJ SOLN
INTRAMUSCULAR | Status: AC
  Filled 2024-07-19: qty 2

## 2024-07-19 MED ORDER — ACETAMINOPHEN 10 MG/ML IV SOLN: 1000.0000 mg | Freq: Once | INTRAVENOUS | Status: DC | PRN

## 2024-07-19 MED ORDER — OXYCODONE HCL 5 MG/5ML PO SOLN: 5.0000 mg | Freq: Once | ORAL | Status: DC | PRN

## 2024-07-19 MED ORDER — CHLORHEXIDINE GLUCONATE CLOTH 2 % EX PADS
6.0000 | MEDICATED_PAD | Freq: Once | CUTANEOUS | Status: DC
Start: 2024-07-19 — End: 2024-07-19

## 2024-07-19 SURGICAL SUPPLY — 47 items
BENZOIN TINCTURE PRP APPL 2/3 (GAUZE/BANDAGES/DRESSINGS) ×4 IMPLANT
BLADE EXTENDED COATED 6.5IN (ELECTRODE) IMPLANT
BLADE SURG 10 STRL SS (BLADE) IMPLANT
BLADE SURG 15 STRL LF DISP TIS (BLADE) IMPLANT
BRIEF MESH DISP 2XL (UNDERPADS AND DIAPERS) ×2 IMPLANT
COVER BACK TABLE 60X90IN (DRAPES) ×2 IMPLANT
COVER MAYO STAND STRL (DRAPES) ×2 IMPLANT
DRAPE LAPAROTOMY 100X72 PEDS (DRAPES) ×2 IMPLANT
DRAPE UTILITY XL STRL (DRAPES) ×2 IMPLANT
ELECTRODE REM PT RTRN 9FT ADLT (ELECTROSURGICAL) ×2 IMPLANT
GAUZE 4X4 16PLY ~~LOC~~+RFID DBL (SPONGE) ×2 IMPLANT
GAUZE PAD ABD 8X10 STRL (GAUZE/BANDAGES/DRESSINGS) ×2 IMPLANT
GAUZE SPONGE 4X4 12PLY STRL (GAUZE/BANDAGES/DRESSINGS) ×2 IMPLANT
GLOVE BIO SURGEON STRL SZ7.5 (GLOVE) ×2 IMPLANT
GLOVE INDICATOR 8.0 STRL GRN (GLOVE) ×2 IMPLANT
GOWN STRL REUS W/TWL XL LVL3 (GOWN DISPOSABLE) ×2 IMPLANT
HYDROGEN PEROXIDE 16OZ (MISCELLANEOUS) IMPLANT
KIT SIGMOIDOSCOPE (SET/KITS/TRAYS/PACK) IMPLANT
KIT TURNOVER KIT B (KITS) ×2 IMPLANT
LOOP VASCLR MAXI BLUE 18IN ST (MISCELLANEOUS) IMPLANT
NDL HYPO 22X1.5 SAFETY MO (MISCELLANEOUS) ×2 IMPLANT
NDL HYPO 25X5/8 SAFETYGLIDE (NEEDLE) IMPLANT
NDL SAFETY ECLIPSE 18X1.5 (NEEDLE) IMPLANT
NEEDLE HYPO 22X1.5 SAFETY MO (MISCELLANEOUS) ×2 IMPLANT
NEEDLE HYPO 25X5/8 SAFETYGLIDE (NEEDLE) ×2 IMPLANT
NS IRRIG 500ML POUR BTL (IV SOLUTION) ×2 IMPLANT
PACK BASIN DAY SURGERY FS (CUSTOM PROCEDURE TRAY) ×2 IMPLANT
PAD ARMBOARD POSITIONER FOAM (MISCELLANEOUS) IMPLANT
SLEEVE SCD COMPRESS KNEE MED (STOCKING) ×2 IMPLANT
SPONGE SURGIFOAM ABS GEL 12-7 (HEMOSTASIS) IMPLANT
SUCTION TUBE FRAZIER 10FR DISP (SUCTIONS) IMPLANT
SUT CHROMIC 2 0 SH (SUTURE) IMPLANT
SUT CHROMIC 3 0 SH 27 (SUTURE) IMPLANT
SUT ETHIBOND 0 (SUTURE) IMPLANT
SUT MNCRL AB 4-0 PS2 18 (SUTURE) IMPLANT
SUT VIC AB 2-0 SH 27XBRD (SUTURE) IMPLANT
SUT VIC AB 3-0 SH 18 (SUTURE) IMPLANT
SYR CONTROL 10ML LL (SYRINGE) ×2 IMPLANT
SYR TB 1ML LL NO SAFETY (SYRINGE) IMPLANT
TOWEL GREEN STERILE FF (TOWEL DISPOSABLE) ×2 IMPLANT
TOWEL OR 17X24 6PK STRL BLUE (TOWEL DISPOSABLE) ×2 IMPLANT
TRAP FLUID SMOKE EVACUATOR (MISCELLANEOUS) ×2 IMPLANT
TRAY DSU PREP LF (CUSTOM PROCEDURE TRAY) ×2 IMPLANT
TUBE CONNECTING 20X1/4 (TUBING) ×2 IMPLANT
UNDERPAD 30X36 HEAVY ABSORB (UNDERPADS AND DIAPERS) ×2 IMPLANT
WATER STERILE IRR 500ML POUR (IV SOLUTION) ×2 IMPLANT
YANKAUER SUCT BULB TIP NO VENT (SUCTIONS) ×2 IMPLANT

## 2024-07-19 NOTE — Transfer of Care (Signed)
 Immediate Anesthesia Transfer of Care Note  Patient: Nancy [name]  Procedure(s) Performed: ERASMO UNDER ANESTHESIA, RECTUM SPHINCTEROTOMY, ANAL PERIANAL BOTOX  (Rectum)  Patient Location: PACU  Anesthesia Type:MAC  Level of Consciousness: sedated  Airway & Oxygen Therapy: Patient Spontanous Breathing and Patient connected to face mask oxygen  Post-op Assessment: Report given to RN and Post -op Vital signs reviewed and stable  Post vital signs: Reviewed and stable  Last Vitals:  Vitals Value Taken Time  BP 92/47 07/19/24 09:34  Temp    Pulse 61 07/19/24 09:38  Resp 14 07/19/24 09:38  SpO2 100 % 07/19/24 09:38  Vitals shown include unfiled device data.  Last Pain:  Vitals:   07/19/24 0831  TempSrc: Temporal  PainSc: 5          Complications: No notable events documented.

## 2024-07-19 NOTE — Op Note (Signed)
 07/19/2024  9:26 AM  PATIENT:  Nancy [name]  47 y.o. female  Patient Care Team: Early, Camie BRAVO, NP as PCP - General (Nurse Practitioner)  PRE-OPERATIVE DIAGNOSIS:  Chronic anal fissure  POST-OPERATIVE DIAGNOSIS:  Same  PROCEDURE:   Chemical sphincterotomy - perianal botox  injection, 100 Units Anorectal exam under anesthesia  SURGEON:  Surgeon(s): Teresa Lonni HERO, MD  ASSISTANT: OR Staff   ANESTHESIA:   local and general  SPECIMEN:   none  DISPOSITION OF SPECIMEN:  N/A  COUNTS:  Sponge, needle, and instrument counts were reported correct x2 at conclusion.  EBL: 1 mL  PLAN OF CARE: Discharge to home after PACU  PATIENT DISPOSITION:  PACU - hemodynamically stable.  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  Small hemorrhoids.  Chemical sphincterotomy with 100 units of perianal Botox  injected into the intersphincteric plane administered uneventfully.  DESCRIPTION: The patient was seen in the pre-op holding area. The risks, benefits, complications, treatment options, and expected outcomes were previously discussed with the patient. The patient agreed with the proposed plan and has signed the informed consent form. The patient was brought to the operating room by the surgical team, identified as Nancy [name], and the procedure verified. SCD's were applied. General anesthesia was induced without difficulty.  She was then positioned in lithotomy with Allen stirrups.  Pressure points were evaluated and padded. The patient was then prepped and draped in usual sterile fashion. A time out was completed and the above information confirmed and need for preoperative antibiotics.  A perianal block was then created using a dilute mixture of 0.25% Marcaine  with epinephrine .  After ascertaining that an appropriate level of anesthesia had been achieved, a well lubricated digital rectal exam was performed. This  demonstrated no palpable abnormality/masses.  A Hill-Ferguson anoscope was into the anal canal and circumferential inspection demonstrated healthy appearing anoderm.  Fissuring in the posterior midline with a small 4 to 5 mm hypertrophic anal papilla.  Small internal and external hemorrhoids.  No other notable findings of the distal rectum or anal canal and circumferential anoscopy.    As planned, we proceeded with reconstituting 100 units of Botox  and 2 cc of injectable saline.  The intersphincteric groove is then palpated.  Small aliquots of the Botox  mixture are then infiltrated into the intersphincteric groove circumferentially.  This was done all the way up to the level of the dentate line.  The area is inspected.  Hemostasis is observed.  All sponge, needle, and instrument counts were reported correct.  Ultimately, dressing consisting of 4 x 4's, ABD, and mesh underwear was placed.  She was taken out of lithotomy position, awakened from anesthesia, and transferred to a stretcher for transport to recovery in satisfactory condition.  DISPOSITION: PACU in satisfactory condition.

## 2024-07-19 NOTE — H&P (Signed)
 CC: Here today for surgery  HPI: Nancy [name] is an 48 y.o. female with history of migraines, rheumatoid arthritis (on infliximab for this purpose), whom is seen in the office today as a referral by Dr. Cindie for evaluation of anal fissure.   Colonoscopy with Dr. Cindie at [facility] 05/07/2024: - Anal fissure on perianal exam - Hemorrhoids - Nonbleeding internal hemorrhoids - 2 small polyps removed - Examined portion of ileum was normal  PATH - tubular adenomas without dysplasia  Reports an approximate 6-month history of fairly significant perianal pain with bowel movements that feels sharp knifelike when stool is passing. Occasional bright red blood per rectum. She also notes discomfort when sitting on her bottom for prolonged periods of time. She reports on average she has a bowel movement daily and is typically soft/loose. She occasionally struggles with some diarrhea. Reports her commode times are approximately 5 or so minutes. She is not currently taking any fiber supplements, stool softeners, or laxatives. She does report she drinks mushroom coffee that she uses to help keep her stool soft.  She has trialed topical nifedipine  and applied this perianally 4 times per day for 45+ days with no alteration of her symptoms.  Denies any history of incontinence to gas, liquid or solid stool.   Reports she received Botox  1 time 7 years ago for the treatment of migraines. She has not received any Botox /Dysport in the recent past.  She denies any changes in health or health history since we met in the office. No new medications/allergies. She states she is ready for surgery today.  PMH: migraines, rheumatoid arthritis (on infliximab for this purpose)  PSH: Denies any prior anorectal surgeries or procedures. 4 children - 3 vaginal deliveries -episiotomy with the first.  FHx: Denies any known family history of colorectal, breast, endometrial or ovarian cancer  Social Hx: Denies use  of tobacco; EtOH-6 drinks per week; cannabis use; she is here today by herself. She is employed working as a Aeronautical engineer.   Past Medical History:  Diagnosis Date   Abnormal EKG    Anxiety    Complication of anesthesia    takes a long time to wake up   DDD (degenerative disc disease), lumbar    Depression    Heart murmur IDK   LBBB (left bundle branch block)    Lumbago with sciatica    Paresthesia of lower lip    Rheumatoid arthritis (HCC)    Scleritis    Sleep apnea    Vitamin D deficiency     Past Surgical History:  Procedure Laterality Date   CESAREAN SECTION  1995   COLONOSCOPY N/A 05/07/2024   Procedure: COLONOSCOPY;  Surgeon: Cindie Carlin POUR, DO;  [facility];  Service: Endoscopy;  Laterality: N/A;  200pm, asa 2   LAMINECTOMY     2019   LAPAROSCOPIC HYSTERECTOMY     2010   NISSEN FUNDOPLICATION  2005   SINUS ENDO WITH FUSION N/A 05/20/2024   Procedure: SEPTOPLASTY, BILATERAL INFERIOR TURBINATE REDUCTION, LEFT MAXILLARY ANTROSTOMY WITH TISSUE REMOVAL USING STEALTH NAVIGATION;  Surgeon: Tobie Eldora NOVAK, MD;  Location: [facility];  Service: ENT;  Laterality: N/A;   SPINAL CORD STIMULATOR INSERTION     2021    Family History  Problem Relation Age of Onset   Rheum arthritis Mother    Coronary artery disease Brother        found at autopsy after committing suicide   Suicidality Brother  49       2013   Rheum arthritis Maternal Grandmother     Social:  reports that she has quit smoking. Her smoking use included cigarettes. She has been exposed to tobacco smoke. She has never used smokeless tobacco. She reports current alcohol use. She reports current drug use. Frequency: 7.00 times per week. Drug: Marijuana.  Allergies:  Allergies  Allergen Reactions   Cephalosporins Hives, Diarrhea and Swelling   Pepsin Nausea And Vomiting    Other Reaction(s): Vomiting   Erythromycin Nausea And Vomiting   Famotidine Nausea And Vomiting    Nabumetone    Penicillins Hives   Wound Dressing Adhesive Other (See Comments)    Tears skin    Medications: I have reviewed the patient's current medications.  No results found for this or any previous visit (from the past 48 hours).  No results found.   PE Height 4' 11 (1.499 m), weight 73 kg. Constitutional: NAD; conversant Eyes: Moist conjunctiva; no lid lag; anicteric Lungs: Normal respiratory effort CV: RRR Psychiatric: Appropriate affect  No results found for this or any previous visit (from the past 48 hours).  No results found.  A/P: Nancy [name] is an 48 y.o. female with hx of migraines, rheumatoid arthritis (on infliximab for this purpose) here for evaluation of now chronic anal fissure  -She has trialed topical nifedipine  without alleviation. We did discuss starting a fiber supplement such as Metamucil or Benefiber and continuing this indefinitely, taking 2 tablespoons/day. We also discussed hydration-64 ounces of water per day. We discussed minimizing commode x 2 to 3 minutes and also discussed not spreading the buttocks and sitting on the toilet.  -She would like to descending different than what she is already done and still struggles with symptoms despite topicals. We have therefore discussed options including further observation versus something more aggressive such as a procedure.  - The anatomy and physiology of the anal canal was discussed with her with associated pictures (ASCRS Anal fissure trifold). The pathophysiology of anal fissure was discussed at length with associated pictures and illustrations. - Also provided preprinted hemorrhoid instructions - Advised cessation of any nicotine/tobacco use as well as cannabis use - given her history if we did pursue any procedures, discussed a stepup type approach given her history of having had an episiotomy and 3 prior vaginal deliveries. In her case, would consider starting with perianal Botox  first as  opposed to lateral internal sphincterotomy. - We have reviewed options going forward including further observation vs surgery -chemical sphincterotomy with perianal Botox ; anorectal exam under anesthesia  - The planned procedure, material risks (including, but not limited to, pain, bleeding, infection, scarring, need for blood transfusion, damage to anal sphincter, incontinence of gas and/or stool, need for additional procedures, anal stenosis, rare cases of pelvic sepsis which in severe cases may require things like a colostomy, recurrence, blood clot, pulmonary embolus, pneumonia, heart attack, stroke, death) benefits and alternatives to surgery were discussed at length. I noted a good probability that the procedure would help improve their symptoms. The patient's questions were answered to her satisfaction, she voiced understanding and elected to proceed with surgery. Additionally, we discussed typical postoperative expectations and the recovery process.   - Reports that she has a friend with her today.  She has requested that I not update her friend following her procedure.  Lonni Pizza, MD [facility], [facility]

## 2024-07-19 NOTE — Discharge Instructions (Addendum)
 ANORECTAL SURGERY: POST OP INSTRUCTIONS  DIET: Follow a light bland diet the first 24 hours after arrival home, such as soup, liquids, crackers, etc.  Be sure to include lots of fluids daily.  Avoid fast food or heavy meals as your are more likely to get nauseated.  Eat a low fat diet the next few days after surgery.   Some bleeding with bowel movements is expected for the first couple of days but this should stop in between bowel movements  Take your usually prescribed home medications unless otherwise directed. No foreign bodies per rectum for the next 3 months (enemas, etc)  PAIN CONTROL: It is helpful to take an over-the-counter pain medication regularly for the first few days/weeks.  Choose from the following that works best for you: Ibuprofen  (Advil , etc) Three 200mg  tabs every 6 hours as needed. Acetaminophen  (Tylenol , etc) 500-650mg  every 6 hours as needed NOTE: You may take both of these medications together - most patients find it most helpful when alternating between the two (i.e. Ibuprofen  at 6am, tylenol  at 9am, ibuprofen  at 12pm ..SABRA) A  prescription for pain medication may have been prescribed for you at discharge.  Take your pain medication as prescribed.  If you are having problems/concerns with the prescription medicine, please call us  for further advice.  Avoid getting constipated.  Between the surgery and the pain medications, it is common to experience some constipation.  Increasing fluid intake (64oz of water per day) and taking a fiber supplement (such as Metamucil, Citrucel, FiberCon) 1-2 times a day regularly will usually help prevent this problem from occurring.  Take Miralax (over the counter) 1-2x/day while taking a narcotic pain medication. If no bowel movement after 48hours, you may additionally take a laxative like a bottle of Milk of Magnesia which can be purchased over the counter. Avoid enemas.   Watch out for diarrhea.  If you have many loose bowel movements,  simplify your diet to bland foods.  Stop any stool softeners and decrease your fiber supplement. If this worsens or does not improve, please call us .  Wash / shower every day.  If you were discharged with a dressing, you may remove this the day after your surgery. You may shower normally, getting soap/water on your wound, particularly after bowel movements.  Soaking in a warm bath filled a couple inches (Sitz bath) is a great way to clean the area after a bowel movement and many patients find it is a way to soothe the area.  ACTIVITIES as tolerated:   You may resume regular (light) daily activities beginning the next day--such as daily self-care, walking, climbing stairs--gradually increasing activities as tolerated.  If you can walk 30 minutes without difficulty, it is safe to try more intense activity such as jogging, treadmill, bicycling, low-impact aerobics, etc. Refrain from any heavy lifting or straining for the first 2 weeks after your procedure, particularly if your surgery was for hemorrhoids. Avoid activities that make your pain worse You may drive when you are no longer taking prescription pain medication, you can comfortably wear a seatbelt, and you can safely maneuver your car and apply brakes.  FOLLOW UP in our office Please call [facility] at [phone] to set up an appointment to see your surgeon in the office for a follow-up appointment approximately 2 weeks after your surgery. Make sure that you call for this appointment the day you arrive home to insure a convenient appointment time.  9. If you have disability or family leave forms  that need to be completed, you may have them completed by your primary care physician's office; for return to work instructions, please ask our office staff and they will be happy to assist you in obtaining this documentation   When to call us  [phone]: Poor pain control Reactions / problems with new medications (rash/itching, etc)  Fever over  101.5 F (38.5 C) Inability to urinate Nausea/vomiting Worsening swelling or bruising Continued bleeding from incision. Increased pain, redness, or drainage from the incision  The clinic staff is available to answer your questions during regular business hours (8:30am-5pm).  Please don't hesitate to call and ask to speak to one of our nurses for clinical concerns.   A surgeon from [facility] is always on call at the hospitals   If you have a medical emergency, go to the nearest emergency room or call 911.   [facility] [address], [place], [place]  [address] MAIN: [phone] FAX: [phone] [email or web address]   May take Tylenol  after 2:30 pm, if needed.    Post Anesthesia Home Care Instructions  Activity: Get plenty of rest for the remainder of the day. A responsible individual must stay with you for 24 hours following the procedure.  For the next 24 hours, DO NOT: -Drive a car -Advertising copywriter -Drink alcoholic beverages -Take any medication unless instructed by your physician -Make any legal decisions or sign important papers.  Meals: Start with liquid foods such as gelatin or soup. Progress to regular foods as tolerated. Avoid greasy, spicy, heavy foods. If nausea and/or vomiting occur, drink only clear liquids until the nausea and/or vomiting subsides. Call your physician if vomiting continues.  Special Instructions/Symptoms: Your throat may feel dry or sore from the anesthesia or the breathing tube placed in your throat during surgery. If this causes discomfort, gargle with warm salt water. The discomfort should disappear within 24 hours.  If you had a scopolamine patch placed behind your ear for the management of post- operative nausea and/or vomiting:  1. The medication in the patch is effective for 72 hours, after which it should be removed.  Wrap patch in a tissue and discard in  the trash. Wash hands thoroughly with soap and water. 2. You may remove the patch earlier than 72 hours if you experience unpleasant side effects which may include dry mouth, dizziness or visual disturbances. 3. Avoid touching the patch. Wash your hands with soap and water after contact with the patch.

## 2024-07-19 NOTE — Anesthesia Postprocedure Evaluation (Signed)
 Anesthesia Post Note  Patient: Nancy [name]  Procedure(s) Performed: ERASMO UNDER ANESTHESIA, RECTUM SPHINCTEROTOMY, ANAL PERIANAL BOTOX  (Rectum)     Patient location during evaluation: PACU Anesthesia Type: MAC Level of consciousness: awake and alert Pain management: pain level controlled Vital Signs Assessment: post-procedure vital signs reviewed and stable Respiratory status: spontaneous breathing, nonlabored ventilation, respiratory function stable and patient connected to nasal cannula oxygen Cardiovascular status: stable and blood pressure returned to baseline Postop Assessment: no apparent nausea or vomiting Anesthetic complications: no   No notable events documented.  Last Vitals:  Vitals:   07/19/24 0953 07/19/24 1003  BP: (!) 99/59 126/69  Pulse: (!) 59 62  Resp: 15 16  Temp:  (!) 36.2 C  SpO2: 96% 97%    Last Pain:  Vitals:   07/19/24 1003  TempSrc:   PainSc: 0-No pain                 Garnette DELENA Gab

## 2024-07-20 ENCOUNTER — Encounter (HOSPITAL_BASED_OUTPATIENT_CLINIC_OR_DEPARTMENT_OTHER): Payer: Self-pay | Admitting: Surgery

## 2024-07-23 ENCOUNTER — Other Ambulatory Visit: Payer: Self-pay | Admitting: Gastroenterology

## 2024-07-23 DIAGNOSIS — K580 Irritable bowel syndrome with diarrhea: Secondary | ICD-10-CM

## 2024-08-07 ENCOUNTER — Encounter (INDEPENDENT_AMBULATORY_CARE_PROVIDER_SITE_OTHER): Payer: Self-pay | Admitting: Otolaryngology

## 2024-08-07 ENCOUNTER — Ambulatory Visit (INDEPENDENT_AMBULATORY_CARE_PROVIDER_SITE_OTHER): Admitting: Otolaryngology

## 2024-08-07 VITALS — BP 105/70 | HR 67 | Ht 59.0 in

## 2024-08-07 DIAGNOSIS — R0981 Nasal congestion: Secondary | ICD-10-CM

## 2024-08-07 DIAGNOSIS — Z9889 Other specified postprocedural states: Secondary | ICD-10-CM

## 2024-08-07 DIAGNOSIS — J320 Chronic maxillary sinusitis: Secondary | ICD-10-CM | POA: Diagnosis not present

## 2024-08-07 DIAGNOSIS — J343 Hypertrophy of nasal turbinates: Secondary | ICD-10-CM

## 2024-08-07 DIAGNOSIS — T170XXA Foreign body in nasal sinus, initial encounter: Secondary | ICD-10-CM

## 2024-08-07 DIAGNOSIS — J342 Deviated nasal septum: Secondary | ICD-10-CM

## 2024-08-07 DIAGNOSIS — J3489 Other specified disorders of nose and nasal sinuses: Secondary | ICD-10-CM

## 2024-08-07 NOTE — Progress Notes (Signed)
 S/p left FESS and septo/turbs and removal of dental fragment on 05/20/2024 S: Patient reports Breathing significantly better. Using flonase. No discolored drainage or infections currently. She typically has exacerbations during allergy season but this season she is doing much better.  O: Septum midline, Able to visualize axilla of MT bilaterally; tubinates reduced; no epistaxis. No evidence of infection. Nasal endoscopy was indicated to better evaluate the nose and paranasal sinuses, given the patient's history of recent sinus surgery and exam findings, and is detailed below. Prior to proceeding, verbal consent was obtained.   PROCEDURE: Bilateral Diagnostic Rigid Nasal Endoscopy with LEFT Debridement Pre-procedure diagnosis: Post-operative examination and care after Left Functional Endoscopic Sinus Surgery - of note: this procedure was NOT performed for management of the septoplasty or the turbinate reduction. Post-procedure diagnosis: same Indication: See pre-procedure diagnosis and physical exam above Complications: None apparent EBL: 0 mL Anesthesia: Lidocaine  4% and topical decongestant was topically sprayed in each nasal cavity   Description of Procedure:  Patient was identified. A rigid 30 degree endoscope was utilized to evaluate the sinonasal cavities, mucosa, sinus ostia and turbinates and septum.  Overall, signs of mucosal inflammation are noted but improving. Also noted are post-surgical changes fair amount of persistent crusting with thick mucoid secretion within left middle meatus. These were debrided with a 8 Fr suction. After debridement, sinus cavity patency was improved. No adverse synechiae are noted today Right Middle meatus: patent Right SE Recess: patent Left MM: more patent Left SE Recess: patent   CPT CODE -- 68762 - Mod 79   A/P: 48 y.o. female with left retained tooth fragment within maxillary sinus with chronic max sinusitis on left as well as septal deviation and  nasal obstruction s/p Left FESS and septoplasty.and ITR Sinus cavities debrided. Continue nasal sprays - even though doing well, would recommend regular flonase Continue nasal saline rinses multiple times per week F/u 3 months  [name]  [phone]

## 2024-08-14 ENCOUNTER — Encounter (INDEPENDENT_AMBULATORY_CARE_PROVIDER_SITE_OTHER): Payer: Self-pay

## 2024-08-22 ENCOUNTER — Ambulatory Visit: Payer: Self-pay

## 2024-08-22 ENCOUNTER — Ambulatory Visit: Admitting: Family Medicine

## 2024-08-22 ENCOUNTER — Encounter: Payer: Self-pay | Admitting: Family Medicine

## 2024-08-22 VITALS — BP 128/78 | HR 65 | Ht 59.0 in | Wt 162.8 lb

## 2024-08-22 DIAGNOSIS — T7840XA Allergy, unspecified, initial encounter: Secondary | ICD-10-CM | POA: Diagnosis not present

## 2024-08-22 DIAGNOSIS — H6592 Unspecified nonsuppurative otitis media, left ear: Secondary | ICD-10-CM

## 2024-08-22 MED ORDER — LEVOCETIRIZINE DIHYDROCHLORIDE 5 MG PO TABS: 5.0000 mg | ORAL_TABLET | Freq: Every evening | ORAL | 3 refills | Status: AC

## 2024-08-22 MED ORDER — SULFAMETHOXAZOLE-TRIMETHOPRIM 800-160 MG PO TABS: 1.0000 | ORAL_TABLET | Freq: Two times a day (BID) | ORAL | 0 refills | Status: DC

## 2024-08-22 MED ORDER — FLUCONAZOLE 150 MG PO TABS: 150.0000 mg | ORAL_TABLET | Freq: Once | ORAL | 0 refills | Status: AC

## 2024-08-22 NOTE — Telephone Encounter (Signed)
 FYI Only or Action Required?: FYI only for provider.  Patient was last seen in primary care on 01/12/2024 by Bulah Alm RAMAN, PA-C.  Called Nurse Triage reporting Ear Pain.  Symptoms began yesterday.  Interventions attempted: Nothing.  Symptoms are: gradually worsening.  Triage Disposition: See Physician Within 24 Hours  Patient/caregiver understands and will follow disposition?: Yes  Copied from CRM #[id number]. Topic: Clinical - Red Word Triage >> Aug 22, 2024  9:15 AM Joesph NOVAK wrote: Red Word that prompted transfer to Nurse Triage:  Patient believes she has a ear infection, clogged ear, discomfort pain, Reason for Disposition  Earache  (Exceptions: Brief ear pain of lasting less than 60 minutes, or earache occurring during air travel.)  Answer Assessment - Initial Assessment Questions 1. LOCATION: Which ear is involved?     Left ear  2. ONSET: When did the ear pain start?      Yesterday  3. SEVERITY: How bad is the pain?  (Scale 1-10; mild, moderate or severe)     Moderate pain  4. URI SYMPTOMS: Do you have a runny nose or cough?     Patient reports no cough, but RN noted patient coughing during triage call  5. FEVER: Do you have a fever? If Yes, ask: What is your temperature, how was it measured, and when did it start?     Unsure   6. CAUSE: Have you been swimming recently?, How often do you use Q-TIPS?, Have you had any recent air travel or scuba diving?     Unknown cause, patient endorses chronic ear infections  7. OTHER SYMPTOMS: Do you have any other symptoms? (e.g., decreased hearing, dizziness, headache, stiff neck, vomiting)     Feels clogged  8. PREGNANCY: Is there any chance you are pregnant? When was your last menstrual period?     unk  Protocols used: Rilla

## 2024-08-22 NOTE — Progress Notes (Addendum)
   Subjective:    Patient ID: Nancy [name], female    DOB: 08-05-1976, 48 y.o.   MRN: [id number]  Discussed the use of AI scribe software for clinical note transcription with the patient, who gave verbal consent to proceed.  History of Present Illness   Nancy [name] is a 48 year old female who presents with symptoms of a left ear infection.  She woke up yesterday with her left ear feeling clogged and experiencing drainage. She has a long history of ear infections and is familiar with the symptoms. She has been using Tylenol  and a hot pack, but the ear remains clogged and continues to drain. No pain when pulling on the ear. This ear infection feels different from previous ear pain related to a pinched nerve in her neck, for which she recently underwent a nerve ablation at [facility], alleviating that specific pain.  She has a history of rheumatoid arthritis, for which she is currently taking Avsola every eight weeks. She previously stopped the medication due to concerns about side effects but experienced significant difficulty with mobility during that time, leading her to resume the treatment. She reports seeing her rheumatologist at [facility], most recently in July, and believes she sees her about every six months.  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 She describes the fissure pain as 'like a paper cut' and 'feels like somebody's stabbing me in the asshole all day.' She has a gastroenterologist and has been managing these symptoms separately from her rheumatoid arthritis treatment.  She is allergic to several antibiotics, including penicillins and erythromycin, which cause hives and severe nausea, respectively. She has previously tolerated Bactrim , a sulfa  drug, for infections. She also requests a prescription for a medication to manage yeast infections, which she typically experiences  following antibiotic use.           Review of Systems     Objective:    Physical Exam Physical Exam   HEENT: Right TM and canal is normal.  Left canal is normal however the TM has very poor landmarks and dull.           Assessment & Plan:  Assessment and Plan    Acute left otitis media Confirmed diagnosis with tympanic membrane obscured. Recurrent infections with hearing impairment. Limited treatment options due to drug allergies. - Prescribed Bactrim  for infection. - Prescribed Diflucan  for potential yeast infection. - Renewed Xyzal  prescription.  Multiple drug allergies Allergic to penicillins, cephalosporins, and erythromycin, limiting antibiotic options. - Avoid penicillins, cephalosporins, and erythromycin.

## 2024-09-13 ENCOUNTER — Encounter: Payer: Self-pay | Admitting: Family Medicine

## 2024-09-13 ENCOUNTER — Ambulatory Visit: Payer: Self-pay

## 2024-09-13 ENCOUNTER — Ambulatory Visit: Admitting: Family Medicine

## 2024-09-13 VITALS — BP 126/70 | HR 76 | Wt 156.8 lb

## 2024-09-13 DIAGNOSIS — J302 Other seasonal allergic rhinitis: Secondary | ICD-10-CM

## 2024-09-13 DIAGNOSIS — H6592 Unspecified nonsuppurative otitis media, left ear: Secondary | ICD-10-CM | POA: Diagnosis not present

## 2024-09-13 DIAGNOSIS — B001 Herpesviral vesicular dermatitis: Secondary | ICD-10-CM

## 2024-09-13 MED ORDER — AZELASTINE HCL 0.1 % NA SOLN: 1.0000 | Freq: Two times a day (BID) | NASAL | 12 refills | Status: AC

## 2024-09-13 MED ORDER — PREDNISONE 10 MG (21) PO TBPK: ORAL_TABLET | Freq: Every day | ORAL | 0 refills | Status: DC

## 2024-09-13 MED ORDER — VALACYCLOVIR HCL 1 G PO TABS: 2000.0000 mg | ORAL_TABLET | Freq: Two times a day (BID) | ORAL | 0 refills | Status: AC

## 2024-09-13 MED ORDER — SULFAMETHOXAZOLE-TRIMETHOPRIM 800-160 MG PO TABS: 1.0000 | ORAL_TABLET | Freq: Two times a day (BID) | ORAL | 0 refills | Status: DC

## 2024-09-13 NOTE — Progress Notes (Signed)
 Name: Nancy [name]   Date of Visit: 09/13/24   Date of last visit with me: Visit date not found   CHIEF COMPLAINT:  Chief Complaint  Patient presents with   Ear Pain    Left ear pain.        HPI:  Discussed the use of AI scribe software for clinical note transcription with the patient, who gave verbal consent to proceed.  History of Present Illness Nancy [name] is a 48 year old female who presents with left ear pain and drainage.  She has been experiencing left ear pain and drainage with an inability to hear out of the affected ear. The symptoms began on Monday while she was in [facility], and she returned yesterday. This is her second ear infection in three weeks, with the previous infection also affecting the left ear. She was treated with Bactrim  for ten days during the last episode.  She has a history of frequent ear infections and recently underwent sinus surgery for a deviated septum approximately two to three months ago. Despite the surgery, she continues to experience ear infections. She uses Flonase daily and performs nasal rinses to manage her sinus issues.  She has allergies and is allergic to almost all antibiotics. She has not used Astelin nasal spray before.  She also reports developing cold sores on her lips, which she attributes to using an airplane bathroom. She has not had cold sores in a long time prior to this occurrence.     OBJECTIVE:       02/08/2023   10:21 AM  Depression screen PHQ 2/9  Decreased Interest 0  Down, Depressed, Hopeless 0  PHQ - 2 Score 0     BP Readings from Last 3 Encounters:  09/13/24 126/70  08/22/24 128/78  08/07/24 105/70    BP 126/70   Pulse 76   Wt 156 lb 12.8 oz (71.1 kg)   SpO2 98%   BMI 31.67 kg/m    Physical Exam HEENT: Left ear erythematous.  Physical Exam Constitutional:      Appearance: Normal appearance.  HENT:     Right Ear: Hearing, ear canal and external ear normal. A middle ear  effusion is present.     Left Ear: Ear canal and external ear normal. Tympanic membrane is erythematous.  Neurological:     General: No focal deficit present.     Mental Status: She is alert and oriented to person, place, and time.     ASSESSMENT/PLAN:   Assessment & Plan Left serous otitis media, unspecified chronicity  Seasonal allergic rhinitis due to fungal spores  Recurrent cold sores    Assessment and Plan Assessment & Plan Recurrent left otitis media Second episode in three weeks with hearing loss and drainage. Recent sinus surgery may contribute. Allergic to most antibiotics, Bactrim  tolerated. - Prescribed Bactrim  for 10 days. - Advised to delay prednisone  until at least 4 days of antibiotics have been taken. - Recommended follow-up with ENT if symptoms persist or recur. - Discussed with patient that given that this is the second round of antibiotics within 2 months, she is likely dealing with recurrent infections which may require further warranting with ENT eval.  If this persists or reoccurs we will have her follow-up with them.  In the meantime we will increase her allergy treatment by prescribing Astelin and continuing Flonase.  We will also give her a short course of steroids but I would like for her to delay the steroid  usage until she takes her antibiotics.  Seasonal allergic rhinitis Chronic allergic rhinitis with recent sinus surgery. Allergies may contribute to recurrent ear infections. - Prescribed Astelin nasal spray. - Continue Flonase daily.  Herpes labialis (cold sores) Acute outbreak likely triggered by recent travel. - Prescribed valacyclovir 2 grams twice daily for one day, with option to extend to three days if needed. - Provided a 10-day supply of valacyclovir for future outbreaks. - Advised to initiate treatment at the first sign of cold sore development.     Alashia Brownfield A. Vita MD [facility]

## 2024-09-13 NOTE — Telephone Encounter (Signed)
 FYI Only or Action Required?: FYI only for provider: appointment scheduled on 09/13/2024 at 2pm with Dr Reyne Bustle.  Patient was last seen in primary care on 08/22/2024 by Joyce Norleen BROCKS, MD.  Called Nurse Triage reporting Ear Pain.  Symptoms began a week ago.  Interventions attempted: Other: unknown.  Symptoms are: gradually worsening.  Triage Disposition: See Physician Within 24 Hours  Patient/caregiver understands and will follow disposition?: Yes                     Copied from CRM [id number]. Topic: Clinical - Red Word Triage >> Sep 13, 2024 11:07 AM Amy B wrote: Red Word that prompted transfer to Nurse Triage: Left ear infection, pain, drainage Reason for Disposition  Earache  (Exceptions: Brief ear pain of lasting less than 60 minutes, or earache occurring during air travel.)  Answer Assessment - Initial Assessment Questions Patient states her left ear has been hurting for a week Patient states that it is the same ear she has had problems with and she denies any other symptoms except for left ear pain  Patient is advised to call us  back if anything changes or with any further questions/concerns. Patient is advised that if anything worsens to go to the Emergency Room. Patient verbalized understanding.  Protocols used: Rilla

## 2024-09-25 ENCOUNTER — Ambulatory Visit (INDEPENDENT_AMBULATORY_CARE_PROVIDER_SITE_OTHER): Admitting: Otolaryngology

## 2024-09-25 VITALS — BP 105/71 | HR 76 | Ht 59.0 in

## 2024-09-25 DIAGNOSIS — J3489 Other specified disorders of nose and nasal sinuses: Secondary | ICD-10-CM

## 2024-09-25 DIAGNOSIS — H6122 Impacted cerumen, left ear: Secondary | ICD-10-CM | POA: Diagnosis not present

## 2024-09-25 DIAGNOSIS — Z9889 Other specified postprocedural states: Secondary | ICD-10-CM

## 2024-09-25 DIAGNOSIS — R0981 Nasal congestion: Secondary | ICD-10-CM

## 2024-09-25 DIAGNOSIS — H7322 Unspecified myringitis, left ear: Secondary | ICD-10-CM

## 2024-09-25 DIAGNOSIS — J342 Deviated nasal septum: Secondary | ICD-10-CM | POA: Diagnosis not present

## 2024-09-25 DIAGNOSIS — J343 Hypertrophy of nasal turbinates: Secondary | ICD-10-CM

## 2024-09-25 DIAGNOSIS — H9202 Otalgia, left ear: Secondary | ICD-10-CM

## 2024-09-25 DIAGNOSIS — J320 Chronic maxillary sinusitis: Secondary | ICD-10-CM | POA: Diagnosis not present

## 2024-09-25 DIAGNOSIS — T170XXA Foreign body in nasal sinus, initial encounter: Secondary | ICD-10-CM

## 2024-09-25 DIAGNOSIS — Z0110 Encounter for examination of ears and hearing without abnormal findings: Secondary | ICD-10-CM

## 2024-09-25 MED ORDER — TRAMADOL HCL 50 MG PO TABS
50.0000 mg | ORAL_TABLET | Freq: Four times a day (QID) | ORAL | 0 refills | Status: AC | PRN
Start: 2024-09-25 — End: 2024-09-30

## 2024-09-25 NOTE — Progress Notes (Signed)
 Dear Dr. Oris, Here is my assessment for our mutual patient, Nancy [name]. Thank you for allowing me the opportunity to care for your patient. Please do not hesitate to contact me should you have any other questions. Sincerely, Dr. Eldora Blanch  Otolaryngology Clinic Note Referring provider: Dr. Oris HPI:  Nancy [name] is a 48 y.o. female kindly referred by Dr. Oris for evaluation of left ear pain and discomfort.  Initial visit (01/2024): Patient reports: I've had issues with left ear all my life - had history of multiple ear infections as a kid, had ear tubes. Infections persisted during 20s and 30s but then in 2024, she had multiple ear infections as noted below. Currently, ear does not hurt but when she lays on her side, she feels like there is fullness on left. Pain is better after most recent round of abx and steroids. Most recent time ear drops were also done (ofloxacin ), and they helped but ears started to itch. She reports that she had both ear and postauricular pain and pressure in March 2025.  Hearing down some on left, and some tinnitus but not constant.  She does swim and use earplugs; uses qtips on outside.  She reports that she moved to [place] 2 years ago, and does have significant amount of allergies allergic to everything - did SCIT as an adult x2. Flonase when bad, no neti pot use, uses PO antihistamine  She does report chronic neck pain, and TMJ pain -- the ear symptoms feel different.   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 Still some fullness  on left when she lays down, but no pain in ear, no  postauricular pressure, no vertigo, no drainage. She has been using the drops. She did have an audio and CT Temporal bone as well. Of note, she does report constant left sided fullness and nasal obstruction. She reports that she had a dental extraction and the dentist had a hard time extracting the left maxillary tooth which broke and was in fragments. Since then she has had left sided max pressure and fullness. She denies frequent sinus infections but does have intermittent drainage. She does use flonase and PO antihistamine.  --------------------------------------------------------- S/p sepoto/turbs/FESS 05/2024.  --------------------------------------------------------- 09/2024: Seen in follow for left ear complaints. She reports that she started to have left ear pain and drainage and muffled hearing about a month ago, and then went to [facility] as well. She did have some URI sx in the interim which led to further exacerbation. Dx with AOM AS and given bactrim  for this Oct 2025, which helped some. Then given another course of bactrim  and prednisone , which helped some but continues to have fullness and drainage and discomfort. This is the same ear that bothers her. Sinus wise doing well   H&N Surgery:  see above Personal or FHx of bleeding dz or anesthesia difficulty: no   GLP-1: no AP/AC: no  Tobacco: no. Lives in [place], [place]  PMHx: Migraines, Chronic neck pain with spinal cord stimulator, Rheumatoid arthritis, LBBB(?),  Independent Review of Additional Tests or Records:  Camie Oris, NP (FM): 01/31/2023 and 02/08/2023 and 10/04/2023: severe ear pain, radiates down her neck; h/o TM perforation; noted URI sx; March 2024 Dx  AOM - cefdinir , pred; repeat Rx with doxy and xyzal  in April 2024; Nov 2024 - similar sx after URI, nasal sx and cough; left ear pain and drainage, with Rx with doxy Alm Gent FM (10/13/2023): ongoing ear pain and drainage; no other sx, Rx: cefdinir , cortisporin  Dr.  Joyce 12/26/2023: Left AOM, Rx: Cipro  Alm Gent 01/12/2024: continued left ear pain and sx; Dx: Ofloxacin , pred, Bactrim ; ref to ENT CBC 08/11/2022 and BMP 08/11/2022: BUN/Cr 5/0.65; WBC 5.4 MRI Brain 11/18/2021 independently interpreted with respect to ears:  no significant mastoid effusion, no significant paranasal sinus disease; no retrocochlear lesions noted CT Temporal bones 01/30/2024 independently interpreted and agree with read: b/l mastoids, ME well aerated; left inferior TM thickening; no evidence of cholesteatoma; otic capsule and ossicular chain without noted pathology; noted left septal deviation, left foreign body (tooth fragment) left max with opacification of left max (subtotal); small opacification (mucocele v/s fluid) left frotntoethmoid recess, modest anterior ethmoid opacification on left; right paranasal sinuses clear 02/2024 Audiogram was independently reviewed and interpreted by me and it reveals A/A tymps; normal hearing thresholds; WRT 100% AU at 50dB  SNHL= Sensorineural hearing loss   PMH/Meds/All/SocHx/FamHx/ROS:   Past Medical History:  Diagnosis Date   Abnormal EKG    Anxiety    Complication of anesthesia    takes a long time to wake up   DDD (degenerative disc disease), lumbar    Depression    Heart murmur IDK   LBBB (left bundle branch block)    Lumbago with sciatica    Paresthesia of lower lip    Rheumatoid arthritis (HCC)    Scleritis    Sleep apnea    Vitamin D deficiency      Past Surgical History:  Procedure Laterality Date   CESAREAN SECTION  1995   COLONOSCOPY N/A 05/07/2024   Procedure: COLONOSCOPY;  Surgeon: Cindie Carlin POUR, DO;  [facility];  Service: Endoscopy;  Laterality: N/A;  200pm, asa 2   LAMINECTOMY     2019   LAPAROSCOPIC HYSTERECTOMY     2010   NISSEN FUNDOPLICATION  2005   RECTAL EXAM UNDER ANESTHESIA N/A 07/19/2024   Procedure: EXAM UNDER ANESTHESIA, RECTUM;  Surgeon: Teresa Lonni HERO, MD;  Location: [facility];  Service: General;  Laterality: N/A;   SINUS ENDO WITH FUSION N/A 05/20/2024   Procedure: SEPTOPLASTY, BILATERAL INFERIOR TURBINATE REDUCTION, LEFT MAXILLARY ANTROSTOMY WITH TISSUE REMOVAL USING STEALTH NAVIGATION;  Surgeon: Tobie Eldora NOVAK, MD;  Location: [facility];  Service: ENT;  Laterality: N/A;   SPHINCTEROTOMY  07/19/2024   Procedure: SPHINCTEROTOMY, ANAL PERIANAL BOTOX ;  Surgeon: Teresa Lonni HERO, MD;  Location: [facility];  Service: General;;   SPINAL CORD STIMULATOR INSERTION     2021    Family History  Problem Relation Age of Onset   Rheum arthritis Mother    Coronary artery disease Brother        found at autopsy after committing suicide   Suicidality Brother 49       2013   Rheum arthritis Maternal Grandmother      Social Connections: Not on file      Current Outpatient Medications:    acetaminophen  (TYLENOL ) 500 MG tablet, Take 2 tablets (1,000 mg total) by mouth every 6 (six) hours as needed., Disp: 100 tablet, Rfl: 2   azelastine  (ASTELIN ) 0.1 % nasal spray, Place 1 spray into both nostrils 2 (two) times daily. Use in each nostril as directed, Disp:  30 mL, Rfl: 12   Cholecalciferol (VITAMIN D) 125 MCG (5000 UT) CAPS, Take 1 capsule by mouth 3 (three) times a week., Disp: , Rfl:    ibuprofen  (MOTRIN  IB) 200 MG tablet, Take 2 tablets (400 mg total) by mouth every 6 (six) hours as needed., Disp: 100 tablet, Rfl: 2   inFLIXimab-axxq (AVSOLA IV), Inject into the vein., Disp: , Rfl:    levocetirizine (XYZAL ) 5 MG tablet, Take 1 tablet (5 mg total) by mouth every evening., Disp: 90 tablet, Rfl: 3   Multiple Vitamin (MULTIVITAMIN ADULT PO), Take by mouth., Disp: , Rfl:    predniSONE  (STERAPRED UNI-PAK 21 TAB) 10 MG (21) TBPK tablet, Take by mouth daily. 12 day prednisone  dose pak (48 tablets total), Disp: 48 tablet, Rfl: 0   sulfamethoxazole -trimethoprim  (BACTRIM  DS) 800-160 MG tablet, Take 1 tablet by mouth 2 (two) times  daily., Disp: 20 tablet, Rfl: 0   SUMAtriptan 6 MG/0.5ML SOAJ, Inject into the skin daily., Disp: , Rfl:    tiZANidine (ZANAFLEX) 4 MG tablet, 1 tablet as needed Orally Three times a day, Disp: , Rfl:    traMADol  (ULTRAM ) 50 MG tablet, Take 1 tablet (50 mg total) by mouth every 6 (six) hours as needed for up to 5 days., Disp: 7 tablet, Rfl: 0   Physical Exam:   BP 105/71 (BP Location: Left Arm, Patient Position: Sitting, Cuff Size: Large)   Pulse 76   Ht 4' 11 (1.499 m)   SpO2 97%   BMI 31.67 kg/m   Salient findings:  CN II-XII intact Given history and complaints, ear microscopy was indicated and performed for evaluation with findings as below in physical exam section and in procedures  Right: EAC clear and TM intact with well pneumatized middle ear space, mild myringosclerosis posteriorly; Left: EAC patent, TM intact, some retraction pars flaccida area and fairly diffuse myringitis with some purulence and debris impacted deep which was cleared; posterior TM appears aerated; after clearance (see below), CSF powder applied. NO Granulation in ear. Weber 512: mid Rinne 512: AC > BC b/l  Anterior rhinoscopy: septum relatively midline; b/l IT without significant hypertrophy No lesions of oral cavity/oropharynx; clear b/l TMJ crepitus No obviously palpable neck masses/lymphadenopathy/thyromegaly No respiratory distress or stridor  Seprately Identifiable Procedures:  Prior to proceeding, R/B/A for any procedure were explained and verbal consent obtained Procedure: Bilateral ear microscopy and cerumen and debris removal using microscope (CPT 30789) - Mod 25 Pre-procedure diagnosis: Left ear myringitis, impacted cerumen and debris Post-procedure diagnosis: same Indication: see above; iven patient's otologic complaints and history as well as for improved and comprehensive examination of external ear and tympanic membrane, bilateral otologic examination using microscope was performed and impacted  cerumen removed  Procedure: Patient was placed semi-recumbent. Both ear canals were examined using the microscope with findings above. Impacted Cerumen and debris removed on left using suction with improvement in EAC examination and patency. Purulence deep in EAC suctioned on left and CSF powder applied Patient tolerated the procedure well.       Impression & Plans:  Nancy [name] is a 48 y.o. female with:  1. Nasal septal deviation   2. Hypertrophy of both inferior nasal turbinates   3. Left maxillary sinusitis   4. Foreign body in maxillary sinus   5. Nasal obstruction   6. Nasal congestion   7. S/P FESS (functional endoscopic sinus surgery)   8. Normal hearing test of both ears   9. Left ear pain   10. Myringitis of  left ear    Audio and workup from ear standpoint reassuring; now with recurrent exacerbation from ear standpoint with likely myringitis -- she does not like to do ear drops and cannot tolerate them and as such, CSF powder applied today  Given recent abx, will avoid especially in light of her allergies Nasally: doing well, no exacerbation with ear exacerbation and recent URI; will continue rinses and sprays  F/u 1 month, sooner as needed She is in quite a lot of pain, so will do small amount of PRN tramadol   See below regarding exact medications prescribed this encounter including dosages and route: Meds ordered this encounter  Medications   traMADol  (ULTRAM ) 50 MG tablet    Sig: Take 1 tablet (50 mg total) by mouth every 6 (six) hours as needed for up to 5 days.    Dispense:  7 tablet    Refill:  0      Thank you for allowing me the opportunity to care for your patient. Please do not hesitate to contact me should you have any other questions.  Sincerely, Eldora Blanch, MD Otolaryngologist (ENT), [facility] Phone: [phone] Fax: [phone]  09/29/2024, 5:31 PM   MDM:  Level 4 - 99214 Complexity/Problems addressed: mod -  chronic problems, one with exacerbation Data complexity: low - Morbidity: mod - Prescription Drug prescribed or managed: y

## 2024-09-29 ENCOUNTER — Encounter (INDEPENDENT_AMBULATORY_CARE_PROVIDER_SITE_OTHER): Payer: Self-pay | Admitting: Otolaryngology

## 2024-10-24 ENCOUNTER — Encounter (INDEPENDENT_AMBULATORY_CARE_PROVIDER_SITE_OTHER): Payer: Self-pay | Admitting: Otolaryngology

## 2024-10-24 ENCOUNTER — Ambulatory Visit (INDEPENDENT_AMBULATORY_CARE_PROVIDER_SITE_OTHER): Admitting: Otolaryngology

## 2024-10-24 VITALS — BP 117/70 | HR 81 | Ht 59.0 in

## 2024-10-24 DIAGNOSIS — Z9889 Other specified postprocedural states: Secondary | ICD-10-CM | POA: Diagnosis not present

## 2024-10-24 DIAGNOSIS — J3489 Other specified disorders of nose and nasal sinuses: Secondary | ICD-10-CM | POA: Diagnosis not present

## 2024-10-24 DIAGNOSIS — H9202 Otalgia, left ear: Secondary | ICD-10-CM

## 2024-10-24 DIAGNOSIS — J320 Chronic maxillary sinusitis: Secondary | ICD-10-CM

## 2024-10-24 DIAGNOSIS — J343 Hypertrophy of nasal turbinates: Secondary | ICD-10-CM | POA: Diagnosis not present

## 2024-10-24 DIAGNOSIS — H7322 Unspecified myringitis, left ear: Secondary | ICD-10-CM

## 2024-10-24 DIAGNOSIS — J342 Deviated nasal septum: Secondary | ICD-10-CM

## 2024-10-24 MED ORDER — PREDNISOLONE ACETATE 1 % OP SUSP: OPHTHALMIC | 2 refills | Status: AC

## 2024-10-24 NOTE — Patient Instructions (Signed)
 Use pred forte  drops 6 drops in the nasal rinse daily for 2 weeks Use flonase and astelin  as you already are

## 2024-10-24 NOTE — Progress Notes (Signed)
 Dear Dr. Oris, Here is my assessment for our mutual patient, Nancy Nancy [name]. Thank you for allowing me the opportunity to care for your patient. Please do not hesitate to contact me should you have any other questions. Sincerely, Dr. Eldora [name]  Otolaryngology Clinic Note Referring provider: Dr. Oris HPI:  Nancy Nancy [name] is a 48 y.o. female kindly referred by Dr. Oris for evaluation of left ear pain and discomfort.  Initial visit (01/2024): Patient reports: I've had issues with left ear all my life - had history of multiple ear infections as a kid, had ear tubes. Infections persisted during 20s and 30s but then in 2024, she had multiple ear infections as noted below. Currently, ear does not hurt but when she lays on her side, she feels like there is fullness on left. Pain is better after most recent round of abx and steroids. Most recent time ear drops were also done (ofloxacin ), and they helped but ears started to itch. She reports that she had both ear and postauricular pain and pressure in March 2025.  Hearing down some on left, and some tinnitus but not constant.  She does swim and use earplugs; uses qtips on outside.  She reports that she moved to [place] 2 years ago, and does have significant amount of allergies allergic to everything - did SCIT as an adult x2. Flonase when bad, no neti pot use, uses PO antihistamine  She does report chronic neck pain, and TMJ pain -- the ear symptoms feel different.   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 Still some fullness  on left when she lays down, but no pain in ear, no  postauricular pressure, no vertigo, no drainage. She has been using the drops. She did have an audio and CT Temporal bone as well. Of note, she does report constant left sided fullness and nasal obstruction. She reports that she had a dental extraction and the dentist had a hard time extracting the left maxillary tooth which broke and was in fragments. Since then she has had left sided max pressure and fullness. She denies frequent sinus infections but does have intermittent drainage. She does use flonase and PO antihistamine.  --------------------------------------------------------- S/p sepoto/turbs/FESS 05/2024.  --------------------------------------------------------- 09/2024: Seen in follow for left ear complaints. She reports that she started to have left ear pain and drainage and muffled hearing about a month ago, and then went to [facility] as well. She did have some URI sx in the interim which led to further exacerbation. Dx with AOM AS and given bactrim  for this Oct 2025, which helped some. Then given another course of bactrim  and prednisone , which helped some but continues to have fullness and drainage and discomfort. This is the same ear that bothers her. Sinus wise Nancy [name] well --------------------------------------------------------- 10/24/2024 She is feeling better ear wise, just mild congestion. Some muffled hearing. Sinuses are also Nancy [name] better but she had another URI for which she is Nancy [name] daily rinses and using flonase and astelin . Abx prior did help.     H&N Surgery:  see above Personal or FHx of bleeding dz or anesthesia difficulty: no   GLP-1: no AP/AC: no  Lives in [place], [place]  PMHx: Migraines, Chronic neck pain with  spinal cord stimulator, Rheumatoid arthritis, LBBB(?),  Independent Review of Additional Tests or Records:  Nancy Doing, NP (FM): 01/31/2023 and 02/08/2023 and 10/04/2023: severe ear pain, radiates down her neck; h/o TM perforation; noted URI sx; March 2024  Dx AOM - cefdinir , pred; repeat Rx with doxy and xyzal  in April 2024; Nov 2024 - similar sx after URI, nasal sx and cough; left ear pain and drainage, with Rx with doxy Nancy Nancy [name] FM (10/13/2023): ongoing ear pain and drainage; no other sx, Rx: cefdinir , cortisporin  Dr. Joyce 12/26/2023: Left AOM, Rx: Cipro  Nancy Nancy [name] 01/12/2024: continued left ear pain and sx; Dx: Ofloxacin , pred, Bactrim ; ref to ENT CBC 08/11/2022 and BMP 08/11/2022: BUN/Cr 5/0.65; WBC 5.4 MRI Brain 11/18/2021 independently interpreted with respect to ears:  no significant mastoid effusion, no significant paranasal sinus disease; no retrocochlear lesions noted CT Temporal bones 01/30/2024 independently interpreted and agree with read: b/l mastoids, ME well aerated; left inferior TM thickening; no evidence of cholesteatoma; otic capsule and ossicular chain without noted pathology; noted left septal deviation, left foreign body (tooth fragment) left max with opacification of left max (subtotal); small opacification (mucocele v/s fluid) left frotntoethmoid recess, modest anterior ethmoid opacification on left; right paranasal sinuses clear 02/2024 Audiogram was independently reviewed and interpreted by me and it reveals A/A tymps; normal hearing thresholds; WRT 100% AU at 50dB  SNHL= Sensorineural hearing loss   PMH/Meds/All/SocHx/FamHx/ROS:   Past Medical History:  Diagnosis Date   Abnormal EKG    Anxiety    Complication of anesthesia    takes a long time to wake up   DDD (degenerative disc disease), lumbar    Depression    Heart murmur IDK   LBBB (left bundle branch block)    Lumbago with sciatica    Paresthesia of lower lip    Rheumatoid arthritis (HCC)    Scleritis    Sleep apnea    Vitamin D deficiency      Past Surgical History:  Procedure Laterality Date   CESAREAN SECTION  1995   COLONOSCOPY N/A 05/07/2024   Procedure: COLONOSCOPY;  Surgeon: Cindie Nancy POUR, DO;  [facility];  Service:  Endoscopy;  Laterality: N/A;  200pm, asa 2   LAMINECTOMY     2019   LAPAROSCOPIC HYSTERECTOMY     2010   NISSEN FUNDOPLICATION  2005   RECTAL EXAM UNDER ANESTHESIA N/A 07/19/2024   Procedure: EXAM UNDER ANESTHESIA, RECTUM;  Surgeon: Teresa Lonni HERO, MD;  Location: [facility];  Service: General;  Laterality: N/A;   SINUS ENDO WITH FUSION N/A 05/20/2024   Procedure: SEPTOPLASTY, BILATERAL INFERIOR TURBINATE REDUCTION, LEFT MAXILLARY ANTROSTOMY WITH TISSUE REMOVAL USING STEALTH NAVIGATION;  Surgeon: Tobie Nancy NOVAK, MD;  Location: [facility];  Service: ENT;  Laterality: N/A;   SPHINCTEROTOMY  07/19/2024   Procedure: SPHINCTEROTOMY, ANAL PERIANAL BOTOX ;  Surgeon: Teresa Lonni HERO, MD;  Location: [facility];  Service: General;;   SPINAL CORD STIMULATOR INSERTION     2021    Family History  Problem Relation Age of Onset   Rheum arthritis Mother    Coronary artery disease Brother        found at autopsy after committing suicide   Suicidality Brother 49       2013   Rheum arthritis Maternal Grandmother      Social Connections: Not on file      Current Outpatient Medications:    acetaminophen  (TYLENOL ) 500 MG tablet, Take 2 tablets (1,000  mg total) by mouth every 6 (six) hours as needed., Disp: 100 tablet, Rfl: 2   azelastine  (ASTELIN ) 0.1 % nasal spray, Place 1 spray into both nostrils 2 (two) times daily. Use in each nostril as directed, Disp: 30 mL, Rfl: 12   Cholecalciferol (VITAMIN D) 125 MCG (5000 UT) CAPS, Take 1 capsule by mouth 3 (three) times a week., Disp: , Rfl:    ibuprofen  (MOTRIN  IB) 200 MG tablet, Take 2 tablets (400 mg total) by mouth every 6 (six) hours as needed., Disp: 100 tablet, Rfl: 2   inFLIXimab-axxq (AVSOLA IV), Inject into the vein., Disp: , Rfl:    levocetirizine (XYZAL ) 5 MG tablet, Take 1 tablet (5 mg total) by mouth every evening., Disp: 90 tablet, Rfl: 3   Multiple Vitamin (MULTIVITAMIN ADULT PO), Take by  mouth., Disp: , Rfl:    prednisoLONE  acetate (PRED FORTE ) 1 % ophthalmic suspension, Put 6 drops of the steroid in the nasal rinse daily and use for 2 weeks, Disp: 15 mL, Rfl: 2   predniSONE  (STERAPRED UNI-PAK 21 TAB) 10 MG (21) TBPK tablet, Take by mouth daily. 12 day prednisone  dose pak (48 tablets total), Disp: 48 tablet, Rfl: 0   sulfamethoxazole -trimethoprim  (BACTRIM  DS) 800-160 MG tablet, Take 1 tablet by mouth 2 (two) times daily., Disp: 20 tablet, Rfl: 0   SUMAtriptan 6 MG/0.5ML SOAJ, Inject into the skin daily., Disp: , Rfl:    tiZANidine (ZANAFLEX) 4 MG tablet, 1 tablet as needed Orally Three times a day, Disp: , Rfl:    Physical Exam:   BP 117/70 (BP Location: Left Arm, Patient Position: Sitting, Cuff Size: Large)   Pulse 81   Ht 4' 11 (1.499 m)   SpO2 96%   BMI 31.67 kg/m   Salient findings:  CN II-XII intact  Right: EAC clear and TM intact with well pneumatized middle ear space, mild myringosclerosis posteriorly; Left: EAC patent, TM intact, some retraction pars flaccida area but now complete resolution of myringitis and TM intact with anterior myringosclerosis Anterior rhinoscopy: septum relatively midline; b/l IT without significant hypertrophy.Nasal endoscopy was indicated to better evaluate the nose and paranasal sinuses, given the patient's history and exam findings, and is detailed below. No lesions of oral cavity/oropharynx; clear b/l TMJ crepitus No obviously palpable neck masses/lymphadenopathy/thyromegaly No respiratory distress or stridor  Seprately Identifiable Procedures:  Prior to proceeding, R/B/A for any procedure were explained and verbal consent obtained PROCEDURE: Bilateral Diagnostic Rigid Nasal Endoscopy with LEFT Debridement Pre-procedure diagnosis: Post-operative examination and care after Left Functional Endoscopic Sinus Surgery and exacerbation of sinusitis Post-procedure diagnosis: same Indication: See pre-procedure diagnosis and physical exam  above Complications: None apparent EBL: 0 mL Anesthesia: Lidocaine  4% and topical decongestant was topically sprayed in each nasal cavity   Description of Procedure:  Patient was identified. A rigid 30 degree endoscope was utilized to evaluate the sinonasal cavities, mucosa, sinus ostia and turbinates and septum.  Overall, signs of mucosal inflammation are noted clealry today. Also noted are post-surgical changes fair amount of purulent crusting with thick mucoid secretion within left middle meatus and middle turbinate. Some polypoid edema today in left max. These were debrided with a 8 Fr suction. After debridement, sinus cavity patency was improved. No adverse synechiae are noted today Right Middle meatus: patent Right SE Recess: patent Left MM: more patent Left SE Recess: patent  CPT [id number]  Impression & Plans:  Nancy Nancy [name] is a 48 y.o. female with:  1. Nasal septal deviation   2. Hypertrophy of  both inferior nasal turbinates   3. Left maxillary sinusitis   4. Nasal obstruction   5. S/P FESS (functional endoscopic sinus surgery)   6. Left ear pain   7. Myringitis of left ear    Audio and workup from ear standpoint reassuring; last visit had myringitis -- she does not like to do ear drops and cannot tolerate them and as such, CSF powder applied with complete resolution; advised to monitor, if continues, can just prescribe CSF powder  Nasally: exacerbation after URI; rinses helping but debridement performed given purulent crusting; d/w pt, prefer to avoid abx so will continue medical mgmt and given edema in max will do short course pred forte  drops in rinse x2 weeks daily  - Continue flonase and astelin  - offered f/u but patient would like to f/u as needed  See below regarding exact medications prescribed this encounter including dosages and route: Meds ordered this encounter  Medications   prednisoLONE  acetate (PRED FORTE ) 1 % ophthalmic suspension    Sig: Put 6 drops of  the steroid in the nasal rinse daily and use for 2 weeks    Dispense:  15 mL    Refill:  2      Thank you for allowing me the opportunity to care for your patient. Please do not hesitate to contact me should you have any other questions.  Sincerely, Nancy Blanch, MD Otolaryngologist (ENT), [facility] Phone: [phone] Fax: [phone]  10/24/2024, 10:14 AM   MDM:  Level 4 - 99214 Complexity/Problems addressed: mod - chronic problems, one with exacerbation Data complexity: low - Morbidity: mod - Prescription Drug prescribed or managed: y

## 2024-11-29 ENCOUNTER — Ambulatory Visit: Payer: Self-pay

## 2024-11-29 ENCOUNTER — Ambulatory Visit: Admitting: Medical

## 2024-11-29 VITALS — BP 110/70 | HR 83 | Temp 98.3°F | Wt 154.4 lb

## 2024-11-29 DIAGNOSIS — H938X2 Other specified disorders of left ear: Secondary | ICD-10-CM

## 2024-11-29 DIAGNOSIS — J029 Acute pharyngitis, unspecified: Secondary | ICD-10-CM | POA: Diagnosis not present

## 2024-11-29 LAB — POCT RAPID STREP A (OFFICE): Rapid Strep A Screen: NEGATIVE

## 2024-11-29 LAB — POCT MONO (EPSTEIN BARR VIRUS): Mono, POC: NEGATIVE

## 2024-11-29 MED ORDER — IBUPROFEN 600 MG PO TABS: 600.0000 mg | ORAL_TABLET | Freq: Three times a day (TID) | ORAL | 0 refills | Status: AC | PRN

## 2024-11-29 MED ORDER — SULFAMETHOXAZOLE-TRIMETHOPRIM 800-160 MG PO TABS: 1.0000 | ORAL_TABLET | Freq: Two times a day (BID) | ORAL | 0 refills | Status: AC

## 2024-11-29 NOTE — Telephone Encounter (Signed)
 FYI Only or Action Required?: FYI only for provider: appointment scheduled on 1/23.  Patient was last seen in primary care on 09/13/2024 by Vita Morrow, MD.  Called Nurse Triage reporting Sore Throat.  Symptoms began 3 days ago.  Interventions attempted: Rest, hydration, or home remedies.  Symptoms are: gradually worsening.  Triage Disposition: Home Care  Patient/caregiver understands and will follow disposition?: Yes   Reason for Triage: Pt called in stating that she has an extreme sore throat, pt stated that her throat is really red and swollen. Pt mentioned that she has a small cough, nose stuffy and is also in severe pain. Pt states she doesn't think she is running a fever. Pt also states that this has been ongoing for three days including today. Pt denied having any other symptoms. Warm transferred to nurse triage.  Reason for Disposition  [1] Sore throat with cough/cold symptoms AND [2] present < 5 days  Answer Assessment - Initial Assessment Questions 1. ONSET: When did the throat start hurting? (Hours or days ago)      3 days 2. SEVERITY: How bad is the sore throat? (Scale 1-10; mild, moderate or severe)     Severe 3. STREP EXPOSURE: Has there been any exposure to strep within the past week? If Yes, ask: What type of contact occurred?       4.  VIRAL SYMPTOMS: Are there any symptoms of a cold, such as a runny nose, cough, hoarse voice or red eyes?      Mild cough 5. FEVER: Do you have a fever? If Yes, ask: What is your temperature, how was it measured, and when did it start?      6. PUS ON THE TONSILS: Is there pus on the tonsils in the back of your throat?      7. OTHER SYMPTOMS: Do you have any other symptoms? (e.g., difficulty breathing, headache, rash)     denies 8. PREGNANCY: Is there any chance you are pregnant? When was your last menstrual period?  Protocols used: Sore Throat-A-AH

## 2024-11-29 NOTE — Progress Notes (Signed)
 "  Name: Nancy [name]   Date of Visit: 11/29/24   Date of last visit with me: Visit date not found   CHIEF COMPLAINT:  Chief Complaint  Patient presents with   Acute Visit    Sore throat - day 3. Severe today       HPI:  Discussed the use of AI scribe software for clinical note transcription with the patient, who gave verbal consent to proceed.  History of Present Illness  Nancy [name] is a 49 year old female who presents with a severe sore throat and fatigue.  She has experienced a severe sore throat for three days, describing it as 'my throat is on fire.' There is no fever, cough, or nausea, but she feels extremely tired. She has not been using any specific treatments for her sore throat and does not have a thermometer to check for fever.  She has a history of cervical spine issues and underwent a facet ablation in October, which affects her ability to feel ear pain. She has had a persistent headache for at least a month, which she believes may be related to her neck issues.  She has been around several people who have been sick but usually does not get sick herself.   No other aggravating or relieving factors. No other complaint.   Past Medical History:  Diagnosis Date   Abnormal EKG    Anxiety    Complication of anesthesia    takes a long time to wake up   DDD (degenerative disc disease), lumbar    Depression    Heart murmur IDK   LBBB (left bundle branch block)    Lumbago with sciatica    Paresthesia of lower lip    Rheumatoid arthritis (HCC)    Scleritis    Sleep apnea    Vitamin D deficiency    Current Outpatient Medications on File Prior to Visit  Medication Sig Dispense Refill   acetaminophen  (TYLENOL ) 500 MG tablet Take 2 tablets (1,000 mg total) by mouth every 6 (six) hours as needed. 100 tablet 2   azelastine  (ASTELIN ) 0.1 % nasal spray Place 1 spray into both nostrils 2 (two) times daily. Use in each nostril as directed 30 mL 12    Cholecalciferol (VITAMIN D) 125 MCG (5000 UT) CAPS Take 1 capsule by mouth 3 (three) times a week.     ibuprofen  (MOTRIN  IB) 200 MG tablet Take 2 tablets (400 mg total) by mouth every 6 (six) hours as needed. 100 tablet 2   inFLIXimab-axxq (AVSOLA IV) Inject into the vein.     levocetirizine (XYZAL ) 5 MG tablet Take 1 tablet (5 mg total) by mouth every evening. 90 tablet 3   Multiple Vitamin (MULTIVITAMIN ADULT PO) Take by mouth.     prednisoLONE  acetate (PRED FORTE ) 1 % ophthalmic suspension Put 6 drops of the steroid in the nasal rinse daily and use for 2 weeks 15 mL 2   SUMAtriptan 6 MG/0.5ML SOAJ Inject into the skin daily.     tiZANidine (ZANAFLEX) 4 MG tablet 1 tablet as needed Orally Three times a day     valACYclovir  (VALTREX ) 500 MG tablet Take 500 mg by mouth 2 (two) times daily.     No current facility-administered medications on file prior to visit.    ROS as in subjective   Objective: BP 110/70   Pulse 83   Temp 98.3 F (36.8 C)   Wt 154 lb 6.4 oz (70 kg)   SpO2  100%   BMI 31.19 kg/m   General appearence: alert, no distress, WD/WN,  HEENT: normocephalic, sclerae anicteric, left TM with mild erytehma and air fluid levels, right TM normal appearing, nares with erythema and some mucoid discharge , pharynx with erythema and post nasal drainage, no exudate Oral cavity: MMM, no lesions Neck: supple, mild shoddy anterior nodes, no thyromegaly, no masses Heart: RRR, normal S1, S2, no murmurs Lungs: CTA bilaterally, no wheezes, rhonchi, or rales     Assessment: Encounter Diagnoses  Name Primary?   Sore throat Yes   Ear pressure, left       Plan:  Acute pharyngitis with otitis media Acute pharyngitis with sore throat, negative strep test, fluid behind eardrum. Likely sinus, ear, and throat infection. Mononucleosis less likely. - negative strep and mononucleosis test. - Prescribed Bactrim  antibiotic - Advised salt water gargles. - Recommended warm fluids. -  Prescribed ibuprofen  600 mg BID. - Suggested OTC Emergency Immune Plus. - Recommended chloraseptic spray.  If not improving in the next 3-4 days, call or recheck   Nancy [name] was seen today for acute visit.  Diagnoses and all orders for this visit:  Sore throat -     Rapid Strep A -     Mono (Epstein Barr Virus)  Ear pressure, left  Other orders -     ibuprofen  (ADVIL ) 600 MG tablet; Take 1 tablet (600 mg total) by mouth every 8 (eight) hours as needed. -     sulfamethoxazole -trimethoprim  (BACTRIM  DS) 800-160 MG tablet; Take 1 tablet by mouth 2 (two) times daily.    F/u prn     "

## 2024-12-03 ENCOUNTER — Ambulatory Visit: Payer: Self-pay

## 2024-12-03 ENCOUNTER — Other Ambulatory Visit: Payer: Self-pay | Admitting: Medical

## 2024-12-03 ENCOUNTER — Telehealth: Payer: Self-pay | Admitting: Family Medicine

## 2024-12-03 MED ORDER — ONDANSETRON HCL 4 MG PO TABS: 4.0000 mg | ORAL_TABLET | Freq: Three times a day (TID) | ORAL | 0 refills | Status: AC | PRN

## 2024-12-03 NOTE — Telephone Encounter (Signed)
 Patient/caller refused triage.  Reason for refusal: demands to speak with staff physically in clinic only.   Reason for Disposition  [1] Caller demands to speak with the PCP AND [2] about sick adult (or sick caller)  Answer Assessment - Initial Assessment Questions 1. SITUATION:  Document reason for call.     Patient called requesting medication for nausea 2. BACKGROUND: Document any background information (e.g., prior calls, known psychiatric history)     OV 11/29/24-Bactrim  3. ASSESSMENT: Document your nursing assessment.     Refused assessment 4. RESPONSE: Document what your response or recommendation was.     This clinical research associate returned call to patient, she asked if this clinical research associate is in the [facility] office now, this clinical research associate responded that this clinical research associate is not physically in the office but can assist with her request. Patient became upset with raised voice demanding to speak with clinic staff. Aware message is being sent to clinic for follow up.  Protocols used: Difficult Call-A-AH Message from Alfonso HERO sent at 12/03/2024 10:16 AM EST  Reason for Triage: patient calling because she has been nauseas from the meds given to her Friday. Doesn't know what to do and wants to see about something being sent to the pharmacy.

## 2024-12-03 NOTE — Telephone Encounter (Signed)
 Called patient back about her complaint with E2C2, she states she does not want to tell 2 complete strangers why she needs an appointment just to make an appointment.  She wants to talk with the office.  I reassured her that her frustrations are real and we appreciate her letting us  know.  She states RX has been sent in.

## 2024-12-04 NOTE — Telephone Encounter (Signed)
 No questions for you.

## 2024-12-17 ENCOUNTER — Ambulatory Visit (INDEPENDENT_AMBULATORY_CARE_PROVIDER_SITE_OTHER): Admitting: Otolaryngology

## 2025-01-30 ENCOUNTER — Ambulatory Visit: Admitting: Nurse Practitioner
